# Patient Record
Sex: Female | Born: 1975 | Race: Black or African American | Hispanic: No | Smoking: Current every day smoker
Health system: Southern US, Community
[De-identification: ages and names within clinical notes are randomized; demographics above are authoritative.]

## PROBLEM LIST (undated history)

## (undated) DIAGNOSIS — D860 Sarcoidosis of lung: Secondary | ICD-10-CM

## (undated) DIAGNOSIS — M542 Cervicalgia: Secondary | ICD-10-CM

## (undated) DIAGNOSIS — N840 Polyp of corpus uteri: Secondary | ICD-10-CM

## (undated) DIAGNOSIS — D649 Anemia, unspecified: Secondary | ICD-10-CM

## (undated) DIAGNOSIS — M419 Scoliosis, unspecified: Secondary | ICD-10-CM

## (undated) HISTORY — DX: Polyp of corpus uteri: N84.0

## (undated) HISTORY — PX: OVARIAN CYST REMOVAL: SHX89

## (undated) HISTORY — DX: Scoliosis, unspecified: M41.9

## (undated) HISTORY — DX: Anemia, unspecified: D64.9

## (undated) HISTORY — PX: TUBAL LIGATION: SHX77

## (undated) HISTORY — DX: Sarcoidosis of lung: D86.0

---
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
## 1995-07-04 HISTORY — PX: CHOLECYSTECTOMY: SHX55

## 1997-12-08 ENCOUNTER — Emergency Department (HOSPITAL_COMMUNITY): Admission: EM | Admit: 1997-12-08 | Discharge: 1997-12-08 | Payer: Self-pay | Admitting: Internal Medicine

## 1998-01-27 ENCOUNTER — Emergency Department (HOSPITAL_COMMUNITY): Admission: EM | Admit: 1998-01-27 | Discharge: 1998-01-27 | Payer: Self-pay | Admitting: Emergency Medicine

## 1998-05-16 ENCOUNTER — Inpatient Hospital Stay (HOSPITAL_COMMUNITY): Admission: AD | Admit: 1998-05-16 | Discharge: 1998-05-16 | Payer: Self-pay | Admitting: Obstetrics and Gynecology

## 1998-05-23 ENCOUNTER — Observation Stay (HOSPITAL_COMMUNITY): Admission: AD | Admit: 1998-05-23 | Discharge: 1998-05-24 | Payer: Self-pay | Admitting: Obstetrics & Gynecology

## 1998-06-29 ENCOUNTER — Inpatient Hospital Stay (HOSPITAL_COMMUNITY): Admission: AD | Admit: 1998-06-29 | Discharge: 1998-06-29 | Payer: Self-pay | Admitting: Obstetrics and Gynecology

## 1998-07-19 ENCOUNTER — Emergency Department (HOSPITAL_COMMUNITY): Admission: EM | Admit: 1998-07-19 | Discharge: 1998-07-20 | Payer: Self-pay | Admitting: Emergency Medicine

## 1998-07-20 ENCOUNTER — Encounter (HOSPITAL_BASED_OUTPATIENT_CLINIC_OR_DEPARTMENT_OTHER): Payer: Self-pay | Admitting: General Surgery

## 1998-07-20 ENCOUNTER — Emergency Department (HOSPITAL_COMMUNITY): Admission: EM | Admit: 1998-07-20 | Discharge: 1998-07-20 | Payer: Self-pay | Admitting: Emergency Medicine

## 1998-08-25 ENCOUNTER — Inpatient Hospital Stay (HOSPITAL_COMMUNITY): Admission: AD | Admit: 1998-08-25 | Discharge: 1998-08-25 | Payer: Self-pay | Admitting: Obstetrics

## 1998-08-25 ENCOUNTER — Encounter: Payer: Self-pay | Admitting: Obstetrics and Gynecology

## 1998-08-31 ENCOUNTER — Encounter: Admission: RE | Admit: 1998-08-31 | Discharge: 1998-09-14 | Payer: Self-pay

## 1998-09-10 ENCOUNTER — Inpatient Hospital Stay (HOSPITAL_COMMUNITY): Admission: AD | Admit: 1998-09-10 | Discharge: 1998-09-10 | Payer: Self-pay | Admitting: Obstetrics and Gynecology

## 1998-09-16 ENCOUNTER — Inpatient Hospital Stay (HOSPITAL_COMMUNITY): Admission: RE | Admit: 1998-09-16 | Discharge: 1998-09-17 | Payer: Self-pay | Admitting: General Surgery

## 1998-10-09 ENCOUNTER — Inpatient Hospital Stay (HOSPITAL_COMMUNITY): Admission: AD | Admit: 1998-10-09 | Discharge: 1998-10-09 | Payer: Self-pay | Admitting: Obstetrics & Gynecology

## 1998-10-26 ENCOUNTER — Inpatient Hospital Stay (HOSPITAL_COMMUNITY): Admission: AD | Admit: 1998-10-26 | Discharge: 1998-10-31 | Payer: Self-pay | Admitting: Obstetrics & Gynecology

## 1998-10-28 ENCOUNTER — Encounter: Payer: Self-pay | Admitting: Obstetrics & Gynecology

## 1998-11-03 ENCOUNTER — Inpatient Hospital Stay (HOSPITAL_COMMUNITY): Admission: AD | Admit: 1998-11-03 | Discharge: 1998-11-03 | Payer: Self-pay | Admitting: Obstetrics & Gynecology

## 1998-11-04 ENCOUNTER — Encounter: Payer: Self-pay | Admitting: Emergency Medicine

## 1998-11-04 ENCOUNTER — Encounter: Payer: Self-pay | Admitting: *Deleted

## 1998-11-04 ENCOUNTER — Inpatient Hospital Stay (HOSPITAL_COMMUNITY): Admission: EM | Admit: 1998-11-04 | Discharge: 1998-11-16 | Payer: Self-pay | Admitting: Emergency Medicine

## 1998-11-04 ENCOUNTER — Encounter: Payer: Self-pay | Admitting: Neurology

## 1998-11-05 ENCOUNTER — Encounter: Payer: Self-pay | Admitting: Neurology

## 1998-11-12 ENCOUNTER — Encounter: Payer: Self-pay | Admitting: *Deleted

## 1999-01-11 ENCOUNTER — Emergency Department (HOSPITAL_COMMUNITY): Admission: EM | Admit: 1999-01-11 | Discharge: 1999-01-11 | Payer: Self-pay | Admitting: Emergency Medicine

## 1999-01-21 ENCOUNTER — Emergency Department (HOSPITAL_COMMUNITY): Admission: EM | Admit: 1999-01-21 | Discharge: 1999-01-21 | Payer: Self-pay | Admitting: Emergency Medicine

## 1999-04-10 ENCOUNTER — Emergency Department (HOSPITAL_COMMUNITY): Admission: EM | Admit: 1999-04-10 | Discharge: 1999-04-11 | Payer: Self-pay

## 1999-04-19 ENCOUNTER — Emergency Department (HOSPITAL_COMMUNITY): Admission: EM | Admit: 1999-04-19 | Discharge: 1999-04-19 | Payer: Self-pay | Admitting: Emergency Medicine

## 1999-06-15 ENCOUNTER — Encounter: Payer: Self-pay | Admitting: *Deleted

## 1999-06-15 ENCOUNTER — Inpatient Hospital Stay (HOSPITAL_COMMUNITY): Admission: AD | Admit: 1999-06-15 | Discharge: 1999-06-15 | Payer: Self-pay | Admitting: *Deleted

## 1999-06-17 ENCOUNTER — Ambulatory Visit (HOSPITAL_COMMUNITY): Admission: RE | Admit: 1999-06-17 | Discharge: 1999-06-17 | Payer: Self-pay | Admitting: Obstetrics and Gynecology

## 1999-06-17 ENCOUNTER — Encounter (INDEPENDENT_AMBULATORY_CARE_PROVIDER_SITE_OTHER): Payer: Self-pay

## 1999-11-02 ENCOUNTER — Emergency Department (HOSPITAL_COMMUNITY): Admission: EM | Admit: 1999-11-02 | Discharge: 1999-11-03 | Payer: Self-pay | Admitting: Emergency Medicine

## 2000-08-09 ENCOUNTER — Emergency Department (HOSPITAL_COMMUNITY): Admission: EM | Admit: 2000-08-09 | Discharge: 2000-08-10 | Payer: Self-pay | Admitting: Emergency Medicine

## 2000-11-08 ENCOUNTER — Emergency Department (HOSPITAL_COMMUNITY): Admission: EM | Admit: 2000-11-08 | Discharge: 2000-11-08 | Payer: Self-pay | Admitting: Emergency Medicine

## 2001-03-30 ENCOUNTER — Encounter: Payer: Self-pay | Admitting: Emergency Medicine

## 2001-03-30 ENCOUNTER — Emergency Department (HOSPITAL_COMMUNITY): Admission: EM | Admit: 2001-03-30 | Discharge: 2001-03-30 | Payer: Self-pay | Admitting: Emergency Medicine

## 2001-07-14 ENCOUNTER — Inpatient Hospital Stay (HOSPITAL_COMMUNITY): Admission: AD | Admit: 2001-07-14 | Discharge: 2001-07-14 | Payer: Self-pay | Admitting: Obstetrics & Gynecology

## 2001-07-15 ENCOUNTER — Inpatient Hospital Stay (HOSPITAL_COMMUNITY): Admission: AD | Admit: 2001-07-15 | Discharge: 2001-07-15 | Payer: Self-pay | Admitting: Obstetrics & Gynecology

## 2001-07-15 ENCOUNTER — Encounter: Payer: Self-pay | Admitting: Obstetrics & Gynecology

## 2001-11-13 ENCOUNTER — Inpatient Hospital Stay (HOSPITAL_COMMUNITY): Admission: AD | Admit: 2001-11-13 | Discharge: 2001-11-13 | Payer: Self-pay | Admitting: Obstetrics and Gynecology

## 2001-11-14 ENCOUNTER — Encounter: Payer: Self-pay | Admitting: Obstetrics and Gynecology

## 2001-11-14 ENCOUNTER — Inpatient Hospital Stay: Admission: AD | Admit: 2001-11-14 | Discharge: 2001-11-16 | Payer: Self-pay | Admitting: *Deleted

## 2001-11-17 ENCOUNTER — Observation Stay (HOSPITAL_COMMUNITY): Admission: AD | Admit: 2001-11-17 | Discharge: 2001-11-18 | Payer: Self-pay | Admitting: Obstetrics and Gynecology

## 2001-11-20 ENCOUNTER — Encounter: Admission: RE | Admit: 2001-11-20 | Discharge: 2001-11-20 | Payer: Self-pay | Admitting: *Deleted

## 2001-11-28 ENCOUNTER — Encounter: Admission: RE | Admit: 2001-11-28 | Discharge: 2001-11-28 | Payer: Self-pay | Admitting: *Deleted

## 2001-12-05 ENCOUNTER — Encounter: Admission: RE | Admit: 2001-12-05 | Discharge: 2001-12-05 | Payer: Self-pay | Admitting: *Deleted

## 2001-12-08 ENCOUNTER — Inpatient Hospital Stay (HOSPITAL_COMMUNITY): Admission: AD | Admit: 2001-12-08 | Discharge: 2001-12-08 | Payer: Self-pay | Admitting: *Deleted

## 2001-12-11 ENCOUNTER — Inpatient Hospital Stay (HOSPITAL_COMMUNITY): Admission: AD | Admit: 2001-12-11 | Discharge: 2001-12-11 | Payer: Self-pay | Admitting: Obstetrics and Gynecology

## 2001-12-19 ENCOUNTER — Encounter: Admission: RE | Admit: 2001-12-19 | Discharge: 2001-12-19 | Payer: Self-pay | Admitting: *Deleted

## 2002-01-02 ENCOUNTER — Encounter: Admission: RE | Admit: 2002-01-02 | Discharge: 2002-01-02 | Payer: Self-pay | Admitting: *Deleted

## 2002-01-16 ENCOUNTER — Encounter: Admission: RE | Admit: 2002-01-16 | Discharge: 2002-01-16 | Payer: Self-pay | Admitting: Obstetrics and Gynecology

## 2002-01-21 ENCOUNTER — Inpatient Hospital Stay (HOSPITAL_COMMUNITY): Admission: AD | Admit: 2002-01-21 | Discharge: 2002-01-21 | Payer: Self-pay | Admitting: *Deleted

## 2002-01-24 ENCOUNTER — Inpatient Hospital Stay (HOSPITAL_COMMUNITY): Admission: AD | Admit: 2002-01-24 | Discharge: 2002-01-24 | Payer: Self-pay | Admitting: Obstetrics and Gynecology

## 2002-01-30 ENCOUNTER — Encounter: Admission: RE | Admit: 2002-01-30 | Discharge: 2002-01-30 | Payer: Self-pay | Admitting: *Deleted

## 2002-02-08 ENCOUNTER — Inpatient Hospital Stay (HOSPITAL_COMMUNITY): Admission: AD | Admit: 2002-02-08 | Discharge: 2002-02-08 | Payer: Self-pay | Admitting: *Deleted

## 2002-02-13 ENCOUNTER — Encounter: Admission: RE | Admit: 2002-02-13 | Discharge: 2002-02-13 | Payer: Self-pay | Admitting: *Deleted

## 2002-02-15 ENCOUNTER — Inpatient Hospital Stay (HOSPITAL_COMMUNITY): Admission: AD | Admit: 2002-02-15 | Discharge: 2002-02-15 | Payer: Self-pay | Admitting: *Deleted

## 2002-02-17 ENCOUNTER — Inpatient Hospital Stay (HOSPITAL_COMMUNITY): Admission: AD | Admit: 2002-02-17 | Discharge: 2002-02-20 | Payer: Self-pay | Admitting: Obstetrics and Gynecology

## 2002-06-28 ENCOUNTER — Emergency Department (HOSPITAL_COMMUNITY): Admission: EM | Admit: 2002-06-28 | Discharge: 2002-06-28 | Payer: Self-pay | Admitting: *Deleted

## 2002-06-28 ENCOUNTER — Encounter: Payer: Self-pay | Admitting: Emergency Medicine

## 2002-07-03 DIAGNOSIS — D860 Sarcoidosis of lung: Secondary | ICD-10-CM

## 2002-07-03 HISTORY — DX: Sarcoidosis of lung: D86.0

## 2002-10-30 ENCOUNTER — Emergency Department (HOSPITAL_COMMUNITY): Admission: EM | Admit: 2002-10-30 | Discharge: 2002-10-30 | Payer: Self-pay | Admitting: Emergency Medicine

## 2003-01-02 ENCOUNTER — Emergency Department (HOSPITAL_COMMUNITY): Admission: AD | Admit: 2003-01-02 | Discharge: 2003-01-02 | Payer: Self-pay | Admitting: Emergency Medicine

## 2003-03-29 ENCOUNTER — Emergency Department (HOSPITAL_COMMUNITY): Admission: EM | Admit: 2003-03-29 | Discharge: 2003-03-29 | Payer: Self-pay | Admitting: Emergency Medicine

## 2003-06-18 ENCOUNTER — Emergency Department (HOSPITAL_COMMUNITY): Admission: EM | Admit: 2003-06-18 | Discharge: 2003-06-19 | Payer: Self-pay | Admitting: Emergency Medicine

## 2003-10-11 ENCOUNTER — Emergency Department (HOSPITAL_COMMUNITY): Admission: EM | Admit: 2003-10-11 | Discharge: 2003-10-11 | Payer: Self-pay | Admitting: *Deleted

## 2003-10-19 ENCOUNTER — Ambulatory Visit (HOSPITAL_COMMUNITY): Admission: RE | Admit: 2003-10-19 | Discharge: 2003-10-19 | Payer: Self-pay | Admitting: *Deleted

## 2003-11-15 ENCOUNTER — Inpatient Hospital Stay (HOSPITAL_COMMUNITY): Admission: AD | Admit: 2003-11-15 | Discharge: 2003-11-16 | Payer: Self-pay | Admitting: Obstetrics and Gynecology

## 2003-11-30 ENCOUNTER — Ambulatory Visit (HOSPITAL_COMMUNITY): Admission: RE | Admit: 2003-11-30 | Discharge: 2003-11-30 | Payer: Self-pay | Admitting: Obstetrics and Gynecology

## 2003-12-01 ENCOUNTER — Encounter: Admission: RE | Admit: 2003-12-01 | Discharge: 2003-12-01 | Payer: Self-pay | Admitting: Obstetrics and Gynecology

## 2003-12-11 ENCOUNTER — Inpatient Hospital Stay (HOSPITAL_COMMUNITY): Admission: AD | Admit: 2003-12-11 | Discharge: 2003-12-11 | Payer: Self-pay | Admitting: Obstetrics & Gynecology

## 2004-01-01 ENCOUNTER — Inpatient Hospital Stay (HOSPITAL_COMMUNITY): Admission: AD | Admit: 2004-01-01 | Discharge: 2004-01-01 | Payer: Self-pay | Admitting: Family Medicine

## 2004-01-09 ENCOUNTER — Emergency Department (HOSPITAL_COMMUNITY): Admission: EM | Admit: 2004-01-09 | Discharge: 2004-01-09 | Payer: Self-pay | Admitting: Internal Medicine

## 2004-01-11 ENCOUNTER — Emergency Department (HOSPITAL_COMMUNITY): Admission: EM | Admit: 2004-01-11 | Discharge: 2004-01-11 | Payer: Self-pay | Admitting: Emergency Medicine

## 2004-01-14 ENCOUNTER — Ambulatory Visit (HOSPITAL_COMMUNITY): Admission: RE | Admit: 2004-01-14 | Discharge: 2004-01-14 | Payer: Self-pay | Admitting: Emergency Medicine

## 2004-01-26 ENCOUNTER — Emergency Department (HOSPITAL_COMMUNITY): Admission: EM | Admit: 2004-01-26 | Discharge: 2004-01-26 | Payer: Self-pay | Admitting: Emergency Medicine

## 2004-01-30 ENCOUNTER — Ambulatory Visit (HOSPITAL_COMMUNITY): Admission: RE | Admit: 2004-01-30 | Discharge: 2004-01-30 | Payer: Self-pay | Admitting: Ophthalmology

## 2004-02-03 ENCOUNTER — Inpatient Hospital Stay (HOSPITAL_COMMUNITY): Admission: EM | Admit: 2004-02-03 | Discharge: 2004-02-09 | Payer: Self-pay | Admitting: Emergency Medicine

## 2004-02-05 ENCOUNTER — Encounter (INDEPENDENT_AMBULATORY_CARE_PROVIDER_SITE_OTHER): Payer: Self-pay | Admitting: *Deleted

## 2004-02-09 ENCOUNTER — Encounter (INDEPENDENT_AMBULATORY_CARE_PROVIDER_SITE_OTHER): Payer: Self-pay | Admitting: Specialist

## 2004-02-15 ENCOUNTER — Encounter: Admission: RE | Admit: 2004-02-15 | Discharge: 2004-02-15 | Payer: Self-pay | Admitting: Internal Medicine

## 2004-02-17 ENCOUNTER — Inpatient Hospital Stay (HOSPITAL_COMMUNITY): Admission: AD | Admit: 2004-02-17 | Discharge: 2004-02-22 | Payer: Self-pay | Admitting: Internal Medicine

## 2004-02-17 ENCOUNTER — Encounter: Admission: RE | Admit: 2004-02-17 | Discharge: 2004-02-17 | Payer: Self-pay | Admitting: Internal Medicine

## 2004-03-03 ENCOUNTER — Ambulatory Visit: Payer: Self-pay | Admitting: Internal Medicine

## 2004-03-10 ENCOUNTER — Ambulatory Visit: Payer: Self-pay | Admitting: Infectious Diseases

## 2004-04-06 ENCOUNTER — Ambulatory Visit: Payer: Self-pay | Admitting: Internal Medicine

## 2004-04-12 ENCOUNTER — Ambulatory Visit: Payer: Self-pay | Admitting: Internal Medicine

## 2004-04-12 ENCOUNTER — Inpatient Hospital Stay (HOSPITAL_COMMUNITY): Admission: EM | Admit: 2004-04-12 | Discharge: 2004-04-13 | Payer: Self-pay | Admitting: Emergency Medicine

## 2004-04-22 ENCOUNTER — Ambulatory Visit (HOSPITAL_COMMUNITY): Admission: RE | Admit: 2004-04-22 | Discharge: 2004-04-22 | Payer: Self-pay | Admitting: Internal Medicine

## 2004-04-22 ENCOUNTER — Ambulatory Visit: Payer: Self-pay | Admitting: Internal Medicine

## 2004-04-25 ENCOUNTER — Ambulatory Visit (HOSPITAL_COMMUNITY): Admission: RE | Admit: 2004-04-25 | Discharge: 2004-04-25 | Payer: Self-pay | Admitting: Internal Medicine

## 2004-04-27 ENCOUNTER — Ambulatory Visit: Payer: Self-pay | Admitting: Internal Medicine

## 2004-06-15 ENCOUNTER — Ambulatory Visit: Payer: Self-pay | Admitting: Internal Medicine

## 2004-06-17 ENCOUNTER — Inpatient Hospital Stay (HOSPITAL_COMMUNITY): Admission: AD | Admit: 2004-06-17 | Discharge: 2004-06-17 | Payer: Self-pay | Admitting: Obstetrics & Gynecology

## 2004-06-21 ENCOUNTER — Ambulatory Visit: Payer: Self-pay | Admitting: *Deleted

## 2004-07-05 ENCOUNTER — Ambulatory Visit: Payer: Self-pay | Admitting: *Deleted

## 2004-07-06 ENCOUNTER — Ambulatory Visit: Payer: Self-pay | Admitting: Internal Medicine

## 2004-07-07 ENCOUNTER — Emergency Department (HOSPITAL_COMMUNITY): Admission: EM | Admit: 2004-07-07 | Discharge: 2004-07-08 | Payer: Self-pay | Admitting: Internal Medicine

## 2004-07-13 ENCOUNTER — Inpatient Hospital Stay (HOSPITAL_COMMUNITY): Admission: AD | Admit: 2004-07-13 | Discharge: 2004-07-13 | Payer: Self-pay | Admitting: *Deleted

## 2004-07-28 ENCOUNTER — Ambulatory Visit: Payer: Self-pay | Admitting: Family Medicine

## 2004-07-30 ENCOUNTER — Inpatient Hospital Stay (HOSPITAL_COMMUNITY): Admission: AD | Admit: 2004-07-30 | Discharge: 2004-07-31 | Payer: Self-pay | Admitting: Family Medicine

## 2004-08-11 ENCOUNTER — Ambulatory Visit: Payer: Self-pay | Admitting: Family Medicine

## 2004-08-14 ENCOUNTER — Inpatient Hospital Stay (HOSPITAL_COMMUNITY): Admission: AD | Admit: 2004-08-14 | Discharge: 2004-08-14 | Payer: Self-pay | Admitting: Family Medicine

## 2004-08-25 ENCOUNTER — Ambulatory Visit: Payer: Self-pay | Admitting: Family Medicine

## 2004-08-25 ENCOUNTER — Ambulatory Visit (HOSPITAL_COMMUNITY): Admission: RE | Admit: 2004-08-25 | Discharge: 2004-08-25 | Payer: Self-pay | Admitting: Family Medicine

## 2004-09-01 ENCOUNTER — Ambulatory Visit: Payer: Self-pay | Admitting: Family Medicine

## 2004-09-15 ENCOUNTER — Ambulatory Visit: Payer: Self-pay | Admitting: Family Medicine

## 2004-09-26 ENCOUNTER — Inpatient Hospital Stay (HOSPITAL_COMMUNITY): Admission: AD | Admit: 2004-09-26 | Discharge: 2004-09-26 | Payer: Self-pay | Admitting: Obstetrics & Gynecology

## 2004-09-29 ENCOUNTER — Ambulatory Visit: Payer: Self-pay | Admitting: Family Medicine

## 2004-10-08 ENCOUNTER — Inpatient Hospital Stay (HOSPITAL_COMMUNITY): Admission: AD | Admit: 2004-10-08 | Discharge: 2004-10-10 | Payer: Self-pay | Admitting: *Deleted

## 2004-10-08 ENCOUNTER — Ambulatory Visit: Payer: Self-pay | Admitting: Family Medicine

## 2004-10-13 ENCOUNTER — Ambulatory Visit: Payer: Self-pay | Admitting: Family Medicine

## 2004-10-27 ENCOUNTER — Ambulatory Visit: Payer: Self-pay | Admitting: Family Medicine

## 2004-10-27 ENCOUNTER — Ambulatory Visit (HOSPITAL_COMMUNITY): Admission: RE | Admit: 2004-10-27 | Discharge: 2004-10-27 | Payer: Self-pay | Admitting: *Deleted

## 2004-10-28 ENCOUNTER — Ambulatory Visit: Payer: Self-pay | Admitting: Family Medicine

## 2004-11-10 ENCOUNTER — Ambulatory Visit: Payer: Self-pay | Admitting: *Deleted

## 2004-11-17 ENCOUNTER — Ambulatory Visit: Payer: Self-pay | Admitting: Internal Medicine

## 2004-11-24 ENCOUNTER — Ambulatory Visit: Payer: Self-pay | Admitting: *Deleted

## 2004-11-27 ENCOUNTER — Ambulatory Visit: Payer: Self-pay | Admitting: Family Medicine

## 2004-11-27 ENCOUNTER — Inpatient Hospital Stay (HOSPITAL_COMMUNITY): Admission: AD | Admit: 2004-11-27 | Discharge: 2004-11-28 | Payer: Self-pay | Admitting: Family Medicine

## 2004-12-01 ENCOUNTER — Ambulatory Visit (HOSPITAL_COMMUNITY): Admission: RE | Admit: 2004-12-01 | Discharge: 2004-12-01 | Payer: Self-pay | Admitting: *Deleted

## 2004-12-03 ENCOUNTER — Inpatient Hospital Stay (HOSPITAL_COMMUNITY): Admission: AD | Admit: 2004-12-03 | Discharge: 2004-12-03 | Payer: Self-pay | Admitting: Obstetrics & Gynecology

## 2004-12-03 ENCOUNTER — Ambulatory Visit: Payer: Self-pay | Admitting: Certified Nurse Midwife

## 2004-12-08 ENCOUNTER — Ambulatory Visit: Payer: Self-pay | Admitting: Family Medicine

## 2004-12-22 ENCOUNTER — Ambulatory Visit: Payer: Self-pay | Admitting: Family Medicine

## 2005-01-04 ENCOUNTER — Ambulatory Visit: Payer: Self-pay | Admitting: Obstetrics & Gynecology

## 2005-01-08 ENCOUNTER — Emergency Department (HOSPITAL_COMMUNITY): Admission: EM | Admit: 2005-01-08 | Discharge: 2005-01-08 | Payer: Self-pay | Admitting: Emergency Medicine

## 2005-01-10 ENCOUNTER — Inpatient Hospital Stay (HOSPITAL_COMMUNITY): Admission: AD | Admit: 2005-01-10 | Discharge: 2005-01-10 | Payer: Self-pay | Admitting: *Deleted

## 2005-01-11 ENCOUNTER — Ambulatory Visit: Payer: Self-pay | Admitting: *Deleted

## 2005-01-14 ENCOUNTER — Ambulatory Visit: Payer: Self-pay | Admitting: Obstetrics and Gynecology

## 2005-01-14 ENCOUNTER — Inpatient Hospital Stay (HOSPITAL_COMMUNITY): Admission: AD | Admit: 2005-01-14 | Discharge: 2005-01-14 | Payer: Self-pay | Admitting: Obstetrics & Gynecology

## 2005-01-18 ENCOUNTER — Ambulatory Visit: Payer: Self-pay | Admitting: *Deleted

## 2005-01-25 ENCOUNTER — Ambulatory Visit: Payer: Self-pay | Admitting: Obstetrics and Gynecology

## 2005-01-25 ENCOUNTER — Encounter (INDEPENDENT_AMBULATORY_CARE_PROVIDER_SITE_OTHER): Payer: Self-pay | Admitting: Specialist

## 2005-01-25 ENCOUNTER — Inpatient Hospital Stay (HOSPITAL_COMMUNITY): Admission: AD | Admit: 2005-01-25 | Discharge: 2005-01-27 | Payer: Self-pay | Admitting: *Deleted

## 2005-02-05 ENCOUNTER — Emergency Department (HOSPITAL_COMMUNITY): Admission: EM | Admit: 2005-02-05 | Discharge: 2005-02-06 | Payer: Self-pay | Admitting: Emergency Medicine

## 2005-03-16 ENCOUNTER — Ambulatory Visit: Payer: Self-pay | Admitting: Internal Medicine

## 2005-03-20 ENCOUNTER — Ambulatory Visit (HOSPITAL_COMMUNITY): Admission: RE | Admit: 2005-03-20 | Discharge: 2005-03-20 | Payer: Self-pay | Admitting: Internal Medicine

## 2005-04-13 ENCOUNTER — Ambulatory Visit: Payer: Self-pay | Admitting: Internal Medicine

## 2005-07-04 ENCOUNTER — Encounter: Admission: RE | Admit: 2005-07-04 | Discharge: 2005-07-04 | Payer: Self-pay | Admitting: Neurology

## 2005-09-15 ENCOUNTER — Emergency Department (HOSPITAL_COMMUNITY): Admission: EM | Admit: 2005-09-15 | Discharge: 2005-09-15 | Payer: Self-pay | Admitting: Emergency Medicine

## 2005-10-31 ENCOUNTER — Emergency Department (HOSPITAL_COMMUNITY): Admission: EM | Admit: 2005-10-31 | Discharge: 2005-11-01 | Payer: Self-pay | Admitting: Emergency Medicine

## 2005-12-27 ENCOUNTER — Inpatient Hospital Stay (HOSPITAL_COMMUNITY): Admission: AD | Admit: 2005-12-27 | Discharge: 2005-12-27 | Payer: Self-pay | Admitting: Gynecology

## 2006-01-11 ENCOUNTER — Inpatient Hospital Stay (HOSPITAL_COMMUNITY): Admission: AD | Admit: 2006-01-11 | Discharge: 2006-01-12 | Payer: Self-pay | Admitting: Gynecology

## 2006-01-29 ENCOUNTER — Encounter
Admission: RE | Admit: 2006-01-29 | Discharge: 2006-04-29 | Payer: Self-pay | Admitting: Physical Medicine & Rehabilitation

## 2006-01-29 ENCOUNTER — Ambulatory Visit: Payer: Self-pay | Admitting: Physical Medicine & Rehabilitation

## 2006-05-11 ENCOUNTER — Inpatient Hospital Stay (HOSPITAL_COMMUNITY): Admission: AD | Admit: 2006-05-11 | Discharge: 2006-05-11 | Payer: Self-pay | Admitting: Obstetrics & Gynecology

## 2006-09-09 ENCOUNTER — Emergency Department (HOSPITAL_COMMUNITY): Admission: EM | Admit: 2006-09-09 | Discharge: 2006-09-09 | Payer: Self-pay | Admitting: Emergency Medicine

## 2006-11-19 ENCOUNTER — Emergency Department (HOSPITAL_COMMUNITY): Admission: EM | Admit: 2006-11-19 | Discharge: 2006-11-19 | Payer: Self-pay | Admitting: Emergency Medicine

## 2006-11-20 ENCOUNTER — Emergency Department (HOSPITAL_COMMUNITY): Admission: EM | Admit: 2006-11-20 | Discharge: 2006-11-20 | Payer: Self-pay | Admitting: Family Medicine

## 2006-12-20 ENCOUNTER — Encounter (INDEPENDENT_AMBULATORY_CARE_PROVIDER_SITE_OTHER): Payer: Self-pay | Admitting: Dermatology

## 2006-12-20 ENCOUNTER — Ambulatory Visit: Payer: Self-pay | Admitting: Internal Medicine

## 2006-12-20 DIAGNOSIS — G722 Myopathy due to other toxic agents: Secondary | ICD-10-CM | POA: Insufficient documentation

## 2006-12-20 DIAGNOSIS — M549 Dorsalgia, unspecified: Secondary | ICD-10-CM | POA: Insufficient documentation

## 2006-12-20 DIAGNOSIS — D869 Sarcoidosis, unspecified: Secondary | ICD-10-CM

## 2006-12-20 LAB — CONVERTED CEMR LAB
Bilirubin Urine: NEGATIVE
Leukocytes, UA: NEGATIVE
Specific Gravity, Urine: 1.024 (ref 1.005–1.03)
Urobilinogen, UA: 1 (ref 0.0–1.0)

## 2006-12-24 ENCOUNTER — Ambulatory Visit: Payer: Self-pay | Admitting: Internal Medicine

## 2007-01-29 ENCOUNTER — Encounter: Admission: RE | Admit: 2007-01-29 | Discharge: 2007-03-11 | Payer: Self-pay | Admitting: *Deleted

## 2007-03-11 ENCOUNTER — Encounter (INDEPENDENT_AMBULATORY_CARE_PROVIDER_SITE_OTHER): Payer: Self-pay | Admitting: *Deleted

## 2007-10-21 ENCOUNTER — Ambulatory Visit: Payer: Self-pay | Admitting: Hospitalist

## 2007-10-21 ENCOUNTER — Encounter (INDEPENDENT_AMBULATORY_CARE_PROVIDER_SITE_OTHER): Payer: Self-pay | Admitting: *Deleted

## 2007-10-21 DIAGNOSIS — R0602 Shortness of breath: Secondary | ICD-10-CM | POA: Insufficient documentation

## 2007-10-21 DIAGNOSIS — IMO0001 Reserved for inherently not codable concepts without codable children: Secondary | ICD-10-CM

## 2007-10-21 DIAGNOSIS — H538 Other visual disturbances: Secondary | ICD-10-CM

## 2007-10-22 ENCOUNTER — Ambulatory Visit (HOSPITAL_COMMUNITY): Admission: RE | Admit: 2007-10-22 | Discharge: 2007-10-22 | Payer: Self-pay | Admitting: *Deleted

## 2007-10-23 LAB — CONVERTED CEMR LAB
Angiotensin 1 Converting Enzyme: 39 units/L (ref 9–67)
Beta hcg, urine, semiquantitative: NEGATIVE
Eosinophils Absolute: 0 10*3/uL (ref 0.0–0.7)
Lymphocytes Relative: 35 % (ref 12–46)
Lymphs Abs: 1.7 10*3/uL (ref 0.7–4.0)
MCV: 91.4 fL (ref 78.0–100.0)
Monocytes Relative: 8 % (ref 3–12)
Neutro Abs: 2.7 10*3/uL (ref 1.7–7.7)
Neutrophils Relative %: 56 % (ref 43–77)
RBC: 3.95 M/uL (ref 3.87–5.11)
TSH: 0.692 microintl units/mL (ref 0.350–5.50)
Total CK: 175 units/L (ref 7–177)
WBC: 4.8 10*3/uL (ref 4.0–10.5)

## 2007-10-25 ENCOUNTER — Ambulatory Visit: Payer: Self-pay | Admitting: Infectious Disease

## 2008-02-06 ENCOUNTER — Emergency Department (HOSPITAL_COMMUNITY): Admission: EM | Admit: 2008-02-06 | Discharge: 2008-02-06 | Payer: Self-pay | Admitting: Family Medicine

## 2008-04-06 ENCOUNTER — Emergency Department (HOSPITAL_COMMUNITY): Admission: EM | Admit: 2008-04-06 | Discharge: 2008-04-07 | Payer: Self-pay | Admitting: Emergency Medicine

## 2008-04-24 ENCOUNTER — Emergency Department (HOSPITAL_COMMUNITY): Admission: EM | Admit: 2008-04-24 | Discharge: 2008-04-24 | Payer: Self-pay | Admitting: Emergency Medicine

## 2008-04-24 ENCOUNTER — Telehealth: Payer: Self-pay | Admitting: *Deleted

## 2008-08-26 ENCOUNTER — Ambulatory Visit: Payer: Self-pay | Admitting: Obstetrics and Gynecology

## 2008-08-26 ENCOUNTER — Encounter: Payer: Self-pay | Admitting: Obstetrics and Gynecology

## 2008-08-27 ENCOUNTER — Encounter: Payer: Self-pay | Admitting: Obstetrics and Gynecology

## 2008-08-27 LAB — CONVERTED CEMR LAB: Yeast Wet Prep HPF POC: NONE SEEN

## 2008-08-31 ENCOUNTER — Ambulatory Visit (HOSPITAL_COMMUNITY): Admission: RE | Admit: 2008-08-31 | Discharge: 2008-08-31 | Payer: Self-pay | Admitting: Obstetrics & Gynecology

## 2008-09-17 ENCOUNTER — Encounter: Payer: Self-pay | Admitting: Obstetrics and Gynecology

## 2009-10-28 ENCOUNTER — Telehealth (INDEPENDENT_AMBULATORY_CARE_PROVIDER_SITE_OTHER): Payer: Self-pay | Admitting: *Deleted

## 2009-10-28 ENCOUNTER — Encounter (INDEPENDENT_AMBULATORY_CARE_PROVIDER_SITE_OTHER): Payer: Self-pay | Admitting: *Deleted

## 2010-08-04 NOTE — Letter (Signed)
Summary: [facility] RECALL LETTER  All     ,     Phone:   Fax:     10/28/2009   Donetta L Iseman [address] [place], [place]  [address]   Dear  Ms. Rosellen Ellefson,   You are due to follow-up with a doctor at the [facility].  We have been unable to contact you by phone.  If you would like to schedule a visit, please call [phone].  If you are receiving your health care somewhere else, please call us and we will take your name off our patient list.  Healthy regards,  Raynaldo Opitz, Director [facility]

## 2010-08-04 NOTE — Progress Notes (Signed)
  Phone Note Outgoing Call   Call placed by: Gentry Fitz,  October 28, 2009 10:56 AM Call placed to: Patient Summary of Call: We attempted to call patient in order to schedule a return appointment, since her last [facility] office visit was more than one year ago.  Since we were unable to reach patient by phone, a letter was sent asking her to contact us.  Initial call taken by: Gentry Fitz,  October 28, 2009 10:56 AM

## 2010-08-30 ENCOUNTER — Ambulatory Visit (INDEPENDENT_AMBULATORY_CARE_PROVIDER_SITE_OTHER): Payer: Self-pay

## 2010-08-30 ENCOUNTER — Inpatient Hospital Stay (INDEPENDENT_AMBULATORY_CARE_PROVIDER_SITE_OTHER)
Admission: RE | Admit: 2010-08-30 | Discharge: 2010-08-30 | Disposition: A | Discharge status: Home / Self Care | Payer: Self-pay | Source: Ambulatory Visit | Attending: Emergency Medicine | Admitting: Emergency Medicine

## 2010-08-30 DIAGNOSIS — M2240 Chondromalacia patellae, unspecified knee: Secondary | ICD-10-CM

## 2010-11-15 NOTE — Group Therapy Note (Signed)
[name], [name] NO.:  [id number]   MEDICAL RECORD NO.:  [id number]          PATIENT TYPE:  WOC   LOCATION:  [facility]                   FACILITY:  [facility]   PHYSICIAN:  Argentina Donovan, MD        DATE OF BIRTH:  March 01, 1976   DATE OF SERVICE:  08/26/2008                                  CLINIC NOTE   The patient is a 35 year old African American female gravida 4 para 3-0-  1-3 with last baby here in July of 2006.  [facility].  She had a tubal ligation  immediately postpartum and since that time she has had postcoital  spotting.  It did not last very long but it was almost every time after  sex and no pain involved.  She apparently failed to tell the people at  the health department about this problem and she was with her husband at  that time.  After the baby she had this problem.  She went several years  without coitus and then has a boyfriend now and every time she has sex  she spots.   EXAMINATION:  External genitalia is normal.  BUS is within normal  limits.  Vagina is clean and well rugated.  Cervix is clean and parous.  A Pap smear was taken.  Also with an endocervical brush I just lightly  touched the endocervix and bleeding started. The uterus is anterior,  normal size, shape, consistency with normal adnexa and free cul-de-sac.  I am going to get an ultrasound to make sure there is no sign of any  submucous fibroids.   IMPRESSION:  She probably has endocervicitis.  I do not see any sign of  any polyp in that area and she does have the strong amine odor so wet  prep was taken, but I am going to treat her with Flagyl for 7 days.  I  am going to have her come back in a couple weeks and go over the  ultrasound.  If it is negative, my plan is to do an endocervical  cryosurgery and see if we can get rid of that problem for her.   IMPRESSION:  Postcoital bleeding, probably endocervicitis.     ______________________________  Argentina Donovan, MD     PR/MEDQ  D:  08/26/2008  T:  08/26/2008  Job:  [id number]
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
## 2010-11-18 NOTE — Discharge Summary (Signed)
NAMEJIMMIE, DATTILIO            ACCOUNT NO.:  [id number]   MEDICAL RECORD NO.:  [id number]          PATIENT TYPE:  INP   LOCATION:  5011                         FACILITY:  [facility]   PHYSICIAN:  Duncan Dull, M.D.     DATE OF BIRTH:  [birth year]   DATE OF ADMISSION:  04/12/2004  DATE OF DISCHARGE:  04/13/2004                                 DISCHARGE SUMMARY   DISCHARGE DIAGNOSES:  1.  Sarcoidosis, diagnosed August 2005, lung biopsy with noncaseating      granulomas and ACE level of 80.  2.  Possible fungal pneumonia, August 2005, diagnosis based on pathology      from a lung biopsy, cultures all negative.  3.  History of anemia thought secondary to chronic disease.  4.  Reactive airway disease.  5.  History of optic neuritis, August 2005.   DISCHARGE MEDICATIONS:  1.  Avelox 500 mg daily.  2.  Prednisone 20 mg daily.  3.  Iron sulfate 325 mg twice daily.   CONSULTANTS:  None.   PROCEDURES:  None.   RADIOLOGY:  CT of the chest with contrast negative for acute pulmonary  embolism, but worsening bilateral hilar and mediastinal lymphadenopathy  consistent with sarcoidosis.   DISCHARGE LABORATORIES:  BUN 13, creatinine 0.9, glucose 197, potassium 3.8.  CBC:  Hemoglobin 11.4, white blood count 14.7, platelets 252,000.  HIV  nonreactive.  Western blot pending.  Blood cultures x2 negative.  Total CK  98.  Urine pregnancy negative.   BRIEF HISTORY OF PRESENT ILLNESS:  The patient is a 35 year old African  American female with above past medical history, who presented to the  emergency department with a 40-month history of cough, body aches, pleuritic-  type chest pain.  The patient had had an extensive hospitalization in August  of 2005 with a workup consisting of bronchoscopy and biopsy which revealed  noncaseating granulomas and presumptive diagnosis of sarcoidosis was made.  There is also some thought that she could have a fungal pneumonia, as a  biopsy from the lung revealed  stains with findings consistent with fungal  infection.  She was treated with antifungals for 1 month, but ultimately  thought not to have had a fungal infection.  She decided to come to the  emergency department because her symptoms were not getting better and she  felt her body aches were possibly worse.  She did report a cough; it was  nonproductive.  She denied any fever, chills, nausea, vomiting or diarrhea.   HOSPITAL COURSE:  PROBLEM #1 - PULMONARY INFILTRATE:  She was noted to have  a new infiltrate on the chest x-ray in the emergency department.  She had  denied any symptoms that would have been consistent with a bacterial  pneumonia, though she was started on Rocephin and Zithromax.  A CT scan of  the chest was performed that revealed worsening hilar adenopathy as well as  other areas on the CT also consistent with sarcoid.  Her case was discussed  with Dr. Danice Goltz, who saw her in her previous admission, and it was  felt that her current presentation was  likely a flare of her sarcoidosis.  Her last admission, she had received approximately 22 days of steroids;  these were discontinued because of a fear of a fungal infection for which  she had received 4 weeks of antifungal therapy.  In discussion with Dr.  Jayme Cloud, it was felt that this was likely a sarcoid flare and it was  decided to try her on a longer course of steroids.  We will discharge her on  prednisone 20 mg daily.  She will follow up in approximately 2 weeks and  have a repeat CT scan of the chest.  If at that time her CT findings are not  improved or have become worse, she will need a mediastinoscopy and biopsy.  As Dr. Jayme Cloud stated, this is likely sarcoid, but could be an atypical-  type infection.  It is of note that during this admission she clinically  looked very well and her O2 saturations were 95% to 100% on room air.   PROBLEM #2 - ANEMIA:  Her hemoglobin remained stable and was 11.4 at  discharge.   She had had a ferritin that was checked in the past and this was  14.  When she was admitted, she was not on any iron, though she had been  discharged on it in the past.  Again, I have encouraged her to take the iron  and have given her another prescription for iron.  This may need to be  worked up further as an outpatient.   PROBLEM #3 - HISTORY OF OPTIC NEURITIS:  She was noted to have an optic  neuritis on admission in August.  During this admission, she had no visual  complaints.   PROBLEM #4 - HYPERGLYCEMIA:  On her discharge labs, she was noted to have a  glucose of 190.  It is noted that she had recently eaten when these labs  were drawn, however, given the fact that she will be on steroids for some  time, she probably has a steroid-induced hyperglycemia.  She may need to be  on medication, at least while on the steroids.   FOLLOWUP:  1.  She will follow up on April 25, 2004 for a CT of her chest to evaluate      the abnormal findings noted on this admission.  2.  She will follow up with Dr. Chapman Fitch in the [facility] on the 26th of October at 9:50 a.m.  At that time, the CT above      will need to be reviewed and decision made as to whether she needs to be      referred for biopsy and mediastinoscopy.       WA/MEDQ  D:  04/13/2004  T:  04/13/2004  Job:  [id number]   cc:   Chapman Fitch, MD  Fax: [phone]

## 2010-11-18 NOTE — Procedures (Signed)
PROCEDURE:  Visually-evoked potential test - [id number].   PHYSICIAN:  Deanna Artis. Sharene Skeans, M.D.   INDICATIONS FOR PROCEDURE:  The patient is a 35 year old with blurred  vision.  This study is being done to rule out optic neuritis.   DESCRIPTION OF PROCEDURE:  The tracing is carried out using a 32 x 32 check  pattern oscillating at 1.9 cycles per second.  A 250 msec period was  displayed following the stimulus, with all latencies and inter-peak  latencies expressed in msec.  Low frequency filter 1 Hz, high frequency  filter 100 Hz, and 100 stimuli were averaged in duplicate to provide the  final response.   FINDINGS:  Stimulation of the left eye produced well-defined wave forms with  very good inter-run correlation.  Latencies were as follows:  N1: 77.18, P1:  103.56, N2:  138.73.  Simulated stimulation of the right eye produced well-defined wave forms with  very good inter-run correlation.  Latencies were as follows:  N1:  87.93,  P1:  106.49, N2:  130.92.   IMPRESSION:  These pattern reversal visually-evoked responses are within  normal limits, and show no evidence of conduction abnormality in the  anterior visual pathways of either eye.    WILLIAM H. Sharene Skeans, M.D.   ZOX:WRUE  D:  02/08/2004 11:29:23  T:  02/08/2004 12:31:27  Job #:  [id number]   cc:   Genene Churn. Love, M.D.  [address]  [address]  [place]  [place] [address]  Fax: [phone]

## 2010-11-18 NOTE — Group Therapy Note (Signed)
[name], [name]                      ACCOUNT NO.:  [id number]   MEDICAL RECORD NO.:  [id number]                   PATIENT TYPE:  OUT   LOCATION:  [facility]                           FACILITY:  [facility]   PHYSICIAN:  Elsie Lincoln, MD                   DATE OF BIRTH:  20-Apr-1976   DATE OF SERVICE:                                    CLINIC NOTE   The patient is a 35 year old female, para 3-0-1-2, LMP Nov 21, 2003, who is  referred here from the [facility] for a followup of right hemorrhagic cyst.  The  patient was initially seen at [facility] on October 11, 2003 for pain  and found to have a right ovary measuring 5.5 x 3.7 x 2.9-cm.  The cyst was  3.2 x 2.2 x 2.4-cm.  It was consistent with hemorrhagic cyst.  The patient  had a followup ultrasound eight days later with no change, and finally the  patient had an ultrasound, on Nov 30, 2003, showing resolution of the cyst.  The uterus was also described as normal and the left ovary was described as  normal.  The patient has almost complete resolution of pain.  She  occasionally has some sharp pains intermittently but nothing of importance.  The patient gets her GYN care from [facility]; however, she would like  to transfer to this clinic.  Her last normal Pap smear was October 2004 and  has had no other history of abnormal Pap smears.  She was placed on a  monophasic pill that she has been taking irregularly to help prevent ovarian  cyst formation in the future.  We underwent extensive counseling on how to  take the pills and why to take them.  The patient does not want to become  pregnant at this time, so she will continue the birth control pills to  prevent this.   PAST MEDICAL HISTORY:  Iron deficiency anemia.   PAST SURGICAL HISTORY:  1. Laparoscopic gallbladder.  2. Laparotomy in 1995 with removal of ovarian cyst by Dr. Clearance Coots.  The     patient said this was a benign finding; however, she does not know any     more  details.  3. Inguinal hernia repair.   GYNECOLOGIC HISTORY:  1. No abnormal Pap smears.  2. Ovarian cyst as above.  No fibroids, and no STDs.   ASSESSMENT:  A 35 year old female with resolution of pain and resolution of  right ovarian cyst.   PLAN:  1. Continue OCPs.  The patient was given a prescription for Ovcon 35.  2. Return to clinic in October 2005 for a Pap smear.  3. The patient advised to not become pregnant within one month of stopping     the pill because of increased risk of twins.  Elsie Lincoln, MD   KL/MEDQ  D:  12/01/2003  T:  12/01/2003  Job:  [id number]
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
## 2010-11-18 NOTE — Op Note (Signed)
[name], ALAN            ACCOUNT NO.:  [id number]   MEDICAL RECORD NO.:  [id number]          PATIENT TYPE:  INP   LOCATION:  9105                          FACILITY:  [facility]   PHYSICIAN:  Phil D. Okey Dupre, M.D.     DATE OF BIRTH:  1976-06-13   DATE OF PROCEDURE:  01/25/2005  DATE OF DISCHARGE:                                 OPERATIVE REPORT   PROCEDURE:  Bilateral tubal ligation, partial salpingectomy.   PREOPERATIVE DIAGNOSIS:  Voluntarily sterilization.   POSTOPERATIVE DIAGNOSIS:  Voluntarily sterilization.   SURGEON:  Javier Glazier. Okey Dupre, M.D.   ANESTHESIA:  Epidural.   ESTIMATED BLOOD LOSS:  Less than 5 mL.   POSTOPERATIVE CONDITION:  Satisfactory.   PROCEDURE:  Under satisfactory epidural anesthesia with the patient in the  dorsal supine position, the abdomen was prepped and draped in the usual  sterile manner and entered through a transverse subumbilical incision  approximately 2 cm in length.  The suture was just below the umbilicus.  On  entering the peritoneal cavity, the right fallopian tube was identified,  grasped with a Babcock clamp, an opening made in the avascular portion of  the broad ligament with a hemostat, through which was brought a 1 plain  catgut suture and tied around the distal and proximal end of the tube to  form a loop above the tie of approximately 2 cm in length.  The second tie  placed just above the aforementioned tie with the same material, the section  of the tube above the ties excised and sent for pathologic diagnosis.  The  free ends of the tubes were thus exposed and coagulated with hot cautery,  and the tube was placed back into the peritoneal cavity.  The same procedure  was carried out with regard to the left tube.  There was no bleeding noted.  At this point the fascia was closed with a 2-0 Vicryl running suture and  subcuticular closure with 3-0 Monocryl.  A dry sterile dressing was applied.  The patient tolerated the procedure well.   Tape, instrument, sponge and  needle count were reported correct at the end of the procedure and the  patient was transferred to the recovery room in satisfactory condition.       PDR/MEDQ  D:  01/25/2005  T:  01/25/2005  Job:  [id number]
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
## 2010-11-18 NOTE — Op Note (Signed)
[name], [name]                      ACCOUNT NO.:  [id number]   MEDICAL RECORD NO.:  [id number]                   PATIENT TYPE:  INP   LOCATION:                                       FACILITY:  [facility]   PHYSICIAN:  Danice Goltz, M.D. [facility]            DATE OF BIRTH:  May 07, 1976   DATE OF PROCEDURE:  DATE OF DISCHARGE:                                 OPERATIVE REPORT   DATE OF PROCEDURE:  February 07, 2004.   NAME OF PROCEDURE:  Video bronchoscopy.   BRIEF HISTORY/INDICATION FOR PROCEDURE:  The patient is a 35 year old  African-American female, who presented with a host of neurologic problems to  suggest potential neurosarcoidosis.  The patient also was noted to have  interstitial changes on chest x-ray and hilar adenopathy.  The findings are  suspicious for sarcoid.  The patient had the procedure discussed with  potential benefits, limitations, and complications of the procedure  described, all of the questions were answered, the patient agreed to  proceed.  The patient is an ASA Risk 2.   The patient was taken to the Endoscopy Suite where she was placed on  monitoring equipment.  She was placed on the fluoroscopy table.  Her  posterior pharynx was anesthetized with Cetacaine spray.  She was  premedicated with 5 mg of Versed and 50 mcg of Fentanyl.  The Olympus video  bronchoscope was advanced via the oral route.  The vocal cords were noted to  be normal.  The bronchoscope was then advanced.  The trachea was normal.  Carina was somewhat full, indicating possible mediastinal adenopathy.  The  bronchoscope was then advanced to the right mainstem bronchus.  This was  normal.  The right upper lobe was noted to have normal variant of four  bifurcations to the anterior, posterior, and apical subsegments.  There were  no endobronchial lesions noted.   Bronchoscope was then advanced through the bronchus intermedius to the right  middle lobe and right lower lobe subsegments.  There  were no endobronchial  lesions noted.  There was some erythema about the lateral subsegment of the  right lower lobe with what appears some early granulation change.  This  subsegment was then chosen for biopsies.   Bronchoscope was then brought to the left. and the left upper lobe bronchi,  lingular bronchi, and lower lobe bronchi were all noted to be without any  inflammation, mucosal change, or endobronchial lesion.  At this point, the  bronchoscope was brought back to the right, bronchoalveolar lavage was done  from the right middle lobe with 60 mL total of normal saline used, yielding  40 mL of alloquat.  At this point, the bronchoscope was then brought to the  right lower lobe subsegment that appeared to be somewhat granular in  appearance, and using fluoroscopy for guidance the biopsy forceps were  advanced as distal as allowable and biopsies were taken x5 under fluoro  guidance.  Scan with the fluoro device did not show any immediate  pneumothorax.  At this point, some heme could be seen arising from the right  lower lobe lateral subsegment that was biopsied, and 1.5 mL of 1:10,000  epinephrine diluted with saline was instilled with good hemostasis achieved.  At this point, the bronchoscope was retrieved and the patient was taken back  to the recovery area without any evidence of untoward side effect.   Chest x-ray postop procedure is pending.   IMPRESSION:  Possible sarcoidosis.   PLAN:  Await results of the transbronchial biopsies and BAL sent for T-cell  subsets and cell counts and differential.                                               Danice Goltz, M.D. [facility]    LG/MEDQ  D:  02/05/2004  T:  02/07/2004  Job:  [id number]   cc:   Ileana Roup, M.D.  [address], [place] [address]  Fax: [phone]   Genene Churn. Love, M.D.  [address]  [address]  [place]  [place] [address]  Fax: [phone]
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
## 2010-11-18 NOTE — Discharge Summary (Signed)
[name], [name]                      ACCOUNT NO.:  [id number]   MEDICAL RECORD NO.:  [id number]                   PATIENT TYPE:  INP   LOCATION:  2014                                 FACILITY:  [facility]   PHYSICIAN:  Ileana Roup, M.D.               DATE OF BIRTH:  [birth year]   DATE OF ADMISSION:  02/03/2004  DATE OF DISCHARGE:  02/09/2004                                 DISCHARGE SUMMARY   ADDENDUM:  New results from the lung transbronchial biopsy demonstrated positive PAS  staining which is worrisome for fungus, and AFB, GMS stains so far have been  negative.  Special stains for organism including PAS for fungus, GMS and AFB  were performed.  There is one small granuloma with a fungal/yeast form that  was seen on the PAS stain for fungus.  The GMS, AFB stains were negative,  this result came in today on February 10, 2004.  Unfortunately, [name]  has gone out of town to [place] on a church trip.  I was able to reach her by  cell phone and inform her of the severity of this illness.  Patient states  that she is feeling fine, but I informed her that being on high-dose  steroids and having this fungus found on her stain puts her at higher risk  for having a systemic infection with the possible risk of death.  Patient  was aware of this situation, said that she would go to the emergency room if  she started feeling bad.  I have tried today to get the patient a  prescription of VFEND (voriconazole) 200 mg b.i.d.  The patient is a  Medicaid patient and cannot fill prescription out of state.  The cost of  this medication is close to $1000, the patient does not wish to come home at  this time.  Advised the patient that this would be in her best interest.  However, have found an alternative method of getting the medication to the  patient.  I found a pharmacy that will dispense it to her family member in  the morning, a 30 pill supply of VFEND from a pharmacy here locally in  [place].  Her Mom then, Mrs. Velma [place], will FedEx it to her tomorrow  and she will receive the medication on Friday.  I advised the patient that  if she does not receive this medication that she needs to urgently return to  [place] to get this medication and the optimal situation would be for  her to return immediately to [place] to get the medication locally.      Darrol Jump, MD                           Ileana Roup, M.D.    SD/MEDQ  D:  02/10/2004  T:  02/11/2004  Job:  [id number]
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
## 2011-03-09 ENCOUNTER — Emergency Department (HOSPITAL_COMMUNITY)
Admission: EM | Admit: 2011-03-09 | Discharge: 2011-03-09 | Disposition: A | Discharge status: Home / Self Care | Payer: Self-pay | Attending: Emergency Medicine | Admitting: Emergency Medicine

## 2011-03-09 ENCOUNTER — Emergency Department (HOSPITAL_COMMUNITY): Payer: Self-pay

## 2011-03-09 DIAGNOSIS — J069 Acute upper respiratory infection, unspecified: Secondary | ICD-10-CM | POA: Insufficient documentation

## 2011-03-09 DIAGNOSIS — R059 Cough, unspecified: Secondary | ICD-10-CM | POA: Insufficient documentation

## 2011-03-09 DIAGNOSIS — R05 Cough: Secondary | ICD-10-CM | POA: Insufficient documentation

## 2011-03-09 DIAGNOSIS — IMO0001 Reserved for inherently not codable concepts without codable children: Secondary | ICD-10-CM | POA: Insufficient documentation

## 2011-03-09 DIAGNOSIS — R51 Headache: Secondary | ICD-10-CM | POA: Insufficient documentation

## 2011-03-09 DIAGNOSIS — J3489 Other specified disorders of nose and nasal sinuses: Secondary | ICD-10-CM | POA: Insufficient documentation

## 2011-03-09 DIAGNOSIS — M549 Dorsalgia, unspecified: Secondary | ICD-10-CM | POA: Insufficient documentation

## 2011-03-09 DIAGNOSIS — R509 Fever, unspecified: Secondary | ICD-10-CM | POA: Insufficient documentation

## 2011-03-09 DIAGNOSIS — R079 Chest pain, unspecified: Secondary | ICD-10-CM | POA: Insufficient documentation

## 2011-03-09 LAB — DIFFERENTIAL
Basophils Absolute: 0 10*3/uL (ref 0.0–0.1)
Lymphocytes Relative: 15 % (ref 12–46)
Lymphs Abs: 0.9 10*3/uL (ref 0.7–4.0)
Neutrophils Relative %: 79 % — ABNORMAL HIGH (ref 43–77)

## 2011-03-09 LAB — COMPREHENSIVE METABOLIC PANEL
ALT: 10 U/L (ref 0–35)
AST: 16 U/L (ref 0–37)
Albumin: 4.2 g/dL (ref 3.5–5.2)
CO2: 27 mEq/L (ref 19–32)
Calcium: 8.8 mg/dL (ref 8.4–10.5)
Chloride: 103 mEq/L (ref 96–112)
GFR calc non Af Amer: >60 mL/min (ref >60)
Sodium: 137 mEq/L (ref 135–145)

## 2011-03-09 LAB — CK TOTAL AND CKMB (NOT AT ARMC)
CK, MB: 1.5 ng/mL (ref 0.3–4.0)
Relative Index: 1.1 (ref 0.0–2.5)

## 2011-03-09 LAB — POCT I-STAT TROPONIN I: Troponin i, poc: 0.01 ng/mL (ref 0.00–0.08)

## 2011-03-09 LAB — CBC
HCT: 35.4 % — ABNORMAL LOW (ref 36.0–46.0)
MCV: 90.3 fL (ref 78.0–100.0)
Platelets: 207 10*3/uL (ref 150–400)
RBC: 3.92 MIL/uL (ref 3.87–5.11)
WBC: 6.2 10*3/uL (ref 4.0–10.5)

## 2011-04-03 LAB — URINALYSIS, ROUTINE W REFLEX MICROSCOPIC
Glucose, UA: NEGATIVE
Protein, ur: 100 — AB
pH: 7.5

## 2011-04-03 LAB — URINE CULTURE: Culture: NO GROWTH

## 2011-04-03 LAB — CBC
HCT: 35.5 — ABNORMAL LOW
Hemoglobin: 12
MCHC: 33.8
MCV: 90.4
RBC: 3.93
RDW: 14.1

## 2011-04-03 LAB — DIFFERENTIAL
Basophils Absolute: 0
Basophils Relative: 0
Eosinophils Relative: 1
Monocytes Absolute: 0.4
Monocytes Relative: 10
Neutro Abs: 2.9

## 2011-04-03 LAB — URINE MICROSCOPIC-ADD ON

## 2012-02-01 ENCOUNTER — Ambulatory Visit (INDEPENDENT_AMBULATORY_CARE_PROVIDER_SITE_OTHER): Payer: BC Managed Care – PPO | Admitting: Physician Assistant

## 2012-02-01 ENCOUNTER — Encounter: Payer: Self-pay | Admitting: Physician Assistant

## 2012-02-01 VITALS — BP 95/55 | HR 72 | Temp 97.1°F | Ht 64.0 in | Wt 122.6 lb

## 2012-02-01 DIAGNOSIS — R102 Pelvic and perineal pain: Secondary | ICD-10-CM

## 2012-02-01 DIAGNOSIS — N949 Unspecified condition associated with female genital organs and menstrual cycle: Secondary | ICD-10-CM

## 2012-02-01 NOTE — Progress Notes (Signed)
  [facility]  Patient name: [name] MRN [id number]  Date of birth: February 14, 1976  CC & HPI  [name] is a 36 y.o. female presenting today for evaluation of abdominal pain.  ABDOMINAL PAIN Location: B lower pelvic pain Onset: worsened on 4 months ago Description: constant dull ache with occasional mild sharpness 5-6X per day Modifying factors: improves with heating pad, worse with full bladder.  No medications   Symptoms Nausea/Vomiting: no Diarrhea: no Constipation: no Melena/BRBPR: no Hematemesis: no Anorexia: no Fever/Chills: no Jaundice: no Dysuria: no Back pain: no Rash: no Weight loss: YES - 4# in 1 month Vaginal bleeding: irregular periods; some intermenstrual bleeding; menstrual cramps worsened over the past year. LMP: January 18, 2012 Alcohol use: no NSAID use: no Last PAP - 2009 -no abnormal pap   ROS  See HPI  Pertinent History Reviewed  Medical & Surgical Hx:  Reviewed: Hx of Sarcoidosis, no treatment, significant for bilateral lubal ligation, ovarian cyst removal, 4 vaginal deliveries Medications: Reviewed & Updated - see associated section Social History: Reviewed - Significant for LPN, former smoker  Objective Findings  Vitals:  Filed Vitals:   02/01/12 1506  BP: 95/55  Pulse: 72  Temp: 97.1 F (36.2 C)    PE: GENERAL:  Adult AA female.  Examined in [facility].  In no discomfort; no respiratory distress.   PSYCH: Alert and appropriately interactive; Insight:Good   H&N: AT/[place], MMM, no scleral icterus, EOMi THORAX: HEART: RRR, S1/S2 heard, no murmur LUNGS: CTA B, no wheezes, no crackles EXTREMITIES: Moves all 4 extremities spontaneously, warm well perfused, no edema, bilateral DP and PT pulses 2/4.   Abdomen: +BS, soft, no rigidity, no guarding, no masses/organomegaly.  Tender and guarding over R suprapubic region.  No rebound, negative mcburney's. PELVIC:  Normal vagina; no CMT, normal ovaries,  Normal uterus, TTP  lower pelvic region R>L    Assessment & Plan   I have seen/examined this patient and agree with the above. SHORES,SUZANNE E.

## 2012-02-01 NOTE — Patient Instructions (Signed)

## 2012-02-01 NOTE — Assessment & Plan Note (Addendum)
R>L pelvic pain that is constant for >37months.  No s/sx of infection.  No change in menstural pattern but is irregular.  Still has 12 periods per year. S/p R ovarian cyst removal and B tubal ligation.   Will scan with pelvic US to evaluate for masses.  DDx includes ovarian tumor/cyst, adhesions, uterine fibroids, MSK pain specifically iliopsoas strain.  Less likely UTI (will check urine), complication of sarcoid, intraabdominal infection (i.e appendicitis), ovarian torsion.  RED FLAGS REVIEWED.   Pt will follow up for PAP smear and to review results

## 2012-02-08 ENCOUNTER — Telehealth: Payer: Self-pay | Admitting: *Deleted

## 2012-02-08 ENCOUNTER — Ambulatory Visit (HOSPITAL_COMMUNITY)
Admission: RE | Admit: 2012-02-08 | Discharge: 2012-02-08 | Disposition: A | Discharge status: Home / Self Care | Payer: BC Managed Care – PPO | Source: Ambulatory Visit | Attending: Physician Assistant | Admitting: Physician Assistant

## 2012-02-08 DIAGNOSIS — N949 Unspecified condition associated with female genital organs and menstrual cycle: Secondary | ICD-10-CM | POA: Insufficient documentation

## 2012-02-08 DIAGNOSIS — R102 Pelvic and perineal pain: Secondary | ICD-10-CM

## 2012-02-08 NOTE — Telephone Encounter (Signed)
Patient called wanting results of her tests and ultrasound.

## 2012-02-09 NOTE — Telephone Encounter (Signed)
Patient advised of Normal [place].

## 2012-09-22 ENCOUNTER — Emergency Department (HOSPITAL_COMMUNITY): Payer: BC Managed Care – PPO

## 2012-09-22 ENCOUNTER — Encounter (HOSPITAL_COMMUNITY): Payer: Self-pay | Admitting: *Deleted

## 2012-09-22 ENCOUNTER — Emergency Department (HOSPITAL_COMMUNITY)
Admission: EM | Admit: 2012-09-22 | Discharge: 2012-09-22 | Disposition: A | Discharge status: Home / Self Care | Payer: BC Managed Care – PPO | Attending: Emergency Medicine | Admitting: Emergency Medicine

## 2012-09-22 DIAGNOSIS — R1110 Vomiting, unspecified: Secondary | ICD-10-CM | POA: Insufficient documentation

## 2012-09-22 DIAGNOSIS — R5381 Other malaise: Secondary | ICD-10-CM | POA: Insufficient documentation

## 2012-09-22 DIAGNOSIS — Z862 Personal history of diseases of the blood and blood-forming organs and certain disorders involving the immune mechanism: Secondary | ICD-10-CM | POA: Insufficient documentation

## 2012-09-22 DIAGNOSIS — R5383 Other fatigue: Secondary | ICD-10-CM | POA: Insufficient documentation

## 2012-09-22 DIAGNOSIS — Z8619 Personal history of other infectious and parasitic diseases: Secondary | ICD-10-CM | POA: Insufficient documentation

## 2012-09-22 DIAGNOSIS — Z8742 Personal history of other diseases of the female genital tract: Secondary | ICD-10-CM | POA: Insufficient documentation

## 2012-09-22 DIAGNOSIS — Z8739 Personal history of other diseases of the musculoskeletal system and connective tissue: Secondary | ICD-10-CM | POA: Insufficient documentation

## 2012-09-22 DIAGNOSIS — R109 Unspecified abdominal pain: Secondary | ICD-10-CM

## 2012-09-22 DIAGNOSIS — R6883 Chills (without fever): Secondary | ICD-10-CM | POA: Insufficient documentation

## 2012-09-22 DIAGNOSIS — Z87891 Personal history of nicotine dependence: Secondary | ICD-10-CM | POA: Insufficient documentation

## 2012-09-22 LAB — POCT I-STAT, CHEM 8
Calcium, Ion: 1.12 mmol/L (ref 1.12–1.23)
Creatinine, Ser: 1 mg/dL (ref 0.50–1.10)
Glucose, Bld: 103 mg/dL — ABNORMAL HIGH (ref 70–99)
HCT: 42 % (ref 36.0–46.0)
Hemoglobin: 14.3 g/dL (ref 12.0–15.0)
TCO2: 25 mmol/L (ref 0–100)

## 2012-09-22 LAB — CBC WITH DIFFERENTIAL/PLATELET
Basophils Relative: 0 % (ref 0–1)
Eosinophils Absolute: 0 10*3/uL (ref 0.0–0.7)
Eosinophils Relative: 0 % (ref 0–5)
Hemoglobin: 13.2 g/dL (ref 12.0–15.0)
Lymphs Abs: 0.3 10*3/uL — ABNORMAL LOW (ref 0.7–4.0)
MCH: 30.6 pg (ref 26.0–34.0)
MCHC: 34.7 g/dL (ref 30.0–36.0)
MCV: 88.2 fL (ref 78.0–100.0)
Monocytes Relative: 2 % — ABNORMAL LOW (ref 3–12)
Neutrophils Relative %: 94 % — ABNORMAL HIGH (ref 43–77)
RBC: 4.31 MIL/uL (ref 3.87–5.11)

## 2012-09-22 LAB — CG4 I-STAT (LACTIC ACID): Lactic Acid, Venous: 0.68 mmol/L (ref 0.5–2.2)

## 2012-09-22 LAB — COMPREHENSIVE METABOLIC PANEL
ALT: 13 U/L (ref 0–35)
AST: 22 U/L (ref 0–37)
Albumin: 4.2 g/dL (ref 3.5–5.2)
CO2: 21 mEq/L (ref 19–32)
Calcium: 9.2 mg/dL (ref 8.4–10.5)
Chloride: 107 mEq/L (ref 96–112)
Creatinine, Ser: 0.94 mg/dL (ref 0.50–1.10)
GFR calc non Af Amer: 77 mL/min — ABNORMAL LOW (ref >90)
Sodium: 140 mEq/L (ref 135–145)
Total Bilirubin: 0.7 mg/dL (ref 0.3–1.2)

## 2012-09-22 LAB — WET PREP, GENITAL

## 2012-09-22 LAB — URINALYSIS, ROUTINE W REFLEX MICROSCOPIC
Ketones, ur: 15 mg/dL — AB
Nitrite: NEGATIVE
Urobilinogen, UA: 1 mg/dL (ref 0.0–1.0)

## 2012-09-22 LAB — RPR: RPR Ser Ql: NONREACTIVE

## 2012-09-22 LAB — POCT PREGNANCY, URINE: Preg Test, Ur: NEGATIVE

## 2012-09-22 LAB — URINE MICROSCOPIC-ADD ON

## 2012-09-22 MED ORDER — ONDANSETRON HCL 4 MG/2ML IJ SOLN
4.0000 mg | Freq: Once | INTRAMUSCULAR | Status: AC
  Administered 2012-09-22: 4 mg via INTRAVENOUS

## 2012-09-22 MED ORDER — ONDANSETRON HCL 4 MG PO TABS: 8.0000 mg | ORAL_TABLET | Freq: Three times a day (TID) | ORAL | Status: DC | PRN

## 2012-09-22 MED ORDER — IOHEXOL 300 MG/ML  SOLN
100.0000 mL | Freq: Once | INTRAMUSCULAR | Status: AC | PRN
  Administered 2012-09-22: 100 mL via INTRAVENOUS

## 2012-09-22 MED ORDER — IOHEXOL 300 MG/ML  SOLN: 50.0000 mL | Freq: Once | INTRAMUSCULAR | Status: DC | PRN

## 2012-09-22 MED ORDER — ONDANSETRON HCL 4 MG/2ML IJ SOLN
INTRAMUSCULAR | Status: AC
  Filled 2012-09-22: qty 2

## 2012-09-22 MED ORDER — ACETAMINOPHEN 325 MG PO TABS
650.0000 mg | ORAL_TABLET | Freq: Once | ORAL | Status: AC
  Administered 2012-09-22: 650 mg via ORAL
  Filled 2012-09-22: qty 2

## 2012-09-22 MED ORDER — ACETAMINOPHEN 500 MG PO TABS: 1000.0000 mg | ORAL_TABLET | Freq: Once | ORAL | Status: DC

## 2012-09-22 MED ORDER — SODIUM CHLORIDE 0.9 % IV BOLUS (SEPSIS)
2000.0000 mL | Freq: Once | INTRAVENOUS | Status: AC
  Administered 2012-09-22: 2000 mL via INTRAVENOUS

## 2012-09-22 NOTE — ED Notes (Signed)
Pt back from radiology. Ambulated to restroom with no issues.

## 2012-09-22 NOTE — ED Notes (Signed)
Pt c/o lower abd pain along with n/v x 1 week. Pt reports she started throwing up bld today so her husband and sister made her come. Pt reports she doesn't want to be here. Pt sts "it is too cold".

## 2012-09-22 NOTE — ED Notes (Signed)
Pt reports vomiting that started today, has started to vomit blood. Has fever, bodyaches, and chills.
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
## 2012-09-22 NOTE — ED Notes (Signed)
Pt eating with family. Able to keep food and fluids down with no issues.

## 2012-09-22 NOTE — ED Notes (Signed)
Attempted to in & out cath, unsuccessful. Another RN will try.

## 2012-09-23 LAB — URINE CULTURE: Culture: NO GROWTH

## 2012-12-28 ENCOUNTER — Encounter (HOSPITAL_COMMUNITY): Payer: Self-pay | Admitting: Emergency Medicine

## 2012-12-28 ENCOUNTER — Emergency Department (HOSPITAL_COMMUNITY): Payer: Self-pay

## 2012-12-28 ENCOUNTER — Emergency Department (HOSPITAL_COMMUNITY)
Admission: EM | Admit: 2012-12-28 | Discharge: 2012-12-29 | Disposition: A | Discharge status: Home / Self Care | Payer: Self-pay | Attending: Emergency Medicine | Admitting: Emergency Medicine

## 2012-12-28 DIAGNOSIS — Z8739 Personal history of other diseases of the musculoskeletal system and connective tissue: Secondary | ICD-10-CM | POA: Insufficient documentation

## 2012-12-28 DIAGNOSIS — R102 Pelvic and perineal pain: Secondary | ICD-10-CM

## 2012-12-28 DIAGNOSIS — Z8742 Personal history of other diseases of the female genital tract: Secondary | ICD-10-CM | POA: Insufficient documentation

## 2012-12-28 DIAGNOSIS — Z79899 Other long term (current) drug therapy: Secondary | ICD-10-CM | POA: Insufficient documentation

## 2012-12-28 DIAGNOSIS — Z8709 Personal history of other diseases of the respiratory system: Secondary | ICD-10-CM | POA: Insufficient documentation

## 2012-12-28 DIAGNOSIS — Z862 Personal history of diseases of the blood and blood-forming organs and certain disorders involving the immune mechanism: Secondary | ICD-10-CM | POA: Insufficient documentation

## 2012-12-28 DIAGNOSIS — Z9851 Tubal ligation status: Secondary | ICD-10-CM | POA: Insufficient documentation

## 2012-12-28 DIAGNOSIS — N949 Unspecified condition associated with female genital organs and menstrual cycle: Secondary | ICD-10-CM | POA: Insufficient documentation

## 2012-12-28 DIAGNOSIS — Z87891 Personal history of nicotine dependence: Secondary | ICD-10-CM | POA: Insufficient documentation

## 2012-12-28 DIAGNOSIS — Z9089 Acquired absence of other organs: Secondary | ICD-10-CM | POA: Insufficient documentation

## 2012-12-28 DIAGNOSIS — R109 Unspecified abdominal pain: Secondary | ICD-10-CM | POA: Insufficient documentation

## 2012-12-28 DIAGNOSIS — Z3202 Encounter for pregnancy test, result negative: Secondary | ICD-10-CM | POA: Insufficient documentation

## 2012-12-28 DIAGNOSIS — Z8619 Personal history of other infectious and parasitic diseases: Secondary | ICD-10-CM | POA: Insufficient documentation

## 2012-12-28 DIAGNOSIS — Z9889 Other specified postprocedural states: Secondary | ICD-10-CM | POA: Insufficient documentation

## 2012-12-28 LAB — URINALYSIS, ROUTINE W REFLEX MICROSCOPIC
Bilirubin Urine: NEGATIVE
Glucose, UA: NEGATIVE mg/dL
Ketones, ur: NEGATIVE mg/dL
Nitrite: NEGATIVE
Specific Gravity, Urine: 1.027 (ref 1.005–1.030)
pH: 6.5 (ref 5.0–8.0)

## 2012-12-28 LAB — URINE MICROSCOPIC-ADD ON

## 2012-12-28 LAB — POCT PREGNANCY, URINE: Preg Test, Ur: NEGATIVE

## 2012-12-28 NOTE — ED Provider Notes (Deleted)
Pt was MSE by me in fast track.  H&P along with labs initiated.  Pt has lower abd pain, also has prior abd surgery.  Will likely need further work up including pelvic exam.    This chart was scribed for non-physician practitioner working with Gavin Pound. Oletta Lamas, MD by Greggory Stallion, ED scribe. This patient was seen in room WTR9/WTR9 and the patient's care was started at 11:28 PM.  CSN: [id number] Arrival date & time 12/28/12  2032   Chief Complaint  Patient presents with  . Urinary Retention   The history is provided by the patient. No language interpreter was used.    HPI Comments: [name] is a 37 y.o. female who presents to the Emergency Department complaining of gradual onset, constant, dull, achy lower abdominal pain with associated urinary retention that started one week ago. She states she feels like she has a UTI. Pt states eating makes the pain worse. She states bowel movement and fluctuation make no difference in pain. Pt denies fever, neck pain, sore throat, visual disturbance, CP, cough, SOB, nausea, emesis, diarrhea, hematuria, dysuria, back pain, HA, weakness, numbness and rash as associated symptoms. LNMP was in March. She states her period is often irregular. Pt states she has been pregnant 4 times, with 4 births.   Past Medical History  Diagnosis Date  . Anemia   . Sarcoidosis of lung 2004  . Polyp, uterus corpus   . Scoliosis    Past Surgical History  Procedure Laterality Date  . Ovarian cyst removal  1995  . Hernia repair  1996    Left Inguinal  . Cholecystectomy  1997  . Tubal ligation     Family History  Problem Relation Age of Onset  . Hypertension Mother   . Cancer Maternal Uncle     bone cancer, liver cancer  . Cancer Maternal Grandmother     lung cancer  . Hypertension Maternal Grandmother    History  Substance Use Topics  . Smoking status: Former Smoker -- 1.00 packs/day for 5 years    Types: Cigarettes  . Smokeless tobacco: Never Used  .  Alcohol Use: No   OB History   Grav Para Term Preterm Abortions TAB SAB Ect Mult Living   5 4 4  1  1  0 0 3     Review of Systems  Gastrointestinal: Positive for abdominal pain.  Genitourinary: Positive for difficulty urinating.  All other systems reviewed and are negative.    Allergies  Aspirin and Hydrocodone  Home Medications   Current Outpatient Rx  Name  Route  Sig  Dispense  Refill  . ondansetron (ZOFRAN) 4 MG tablet   Oral   Take 2 tablets (8 mg total) by mouth every 8 (eight) hours as needed for nausea.   12 tablet   0    BP 101/63  Pulse 68  Temp(Src) 98.7 F (37.1 C) (Oral)  Resp 18  Wt 130 lb (58.968 kg)  BMI 22.3 kg/m2  SpO2 100%  LMP 11/14/2012  Physical Exam  Nursing note and vitals reviewed. Constitutional: She is oriented to person, place, and time. She appears well-developed and well-nourished. No distress.  HENT:  Head: Normocephalic and atraumatic.  Eyes: EOM are normal.  Neck: Neck supple. No tracheal deviation present.  Cardiovascular: Normal rate.   Pulmonary/Chest: Effort normal. No respiratory distress.  Musculoskeletal: Normal range of motion.  No CVA tenderness.  Neurological: She is alert and oriented to person, place, and time.  Skin:  Skin is warm and dry.  Well healing midline abdominal surgical scar with no tenderness.  vague suprapubic tenderness without guarding or rebound tenderness Psychiatric: She has a normal mood and affect. Her behavior is normal.    ED Course  Procedures (including critical care time)  DIAGNOSTIC STUDIES: Oxygen Saturation is 100% on RA, normal by my interpretation.    COORDINATION OF CARE: 11:34 PM-Discussed treatment plan which includes moving her to the back for further evaluation with pt at bedside and pt agreed to plan.   Labs Reviewed  URINALYSIS, ROUTINE W REFLEX MICROSCOPIC  POCT PREGNANCY, URINE   No results found. No diagnosis found.  MDM  BP 101/63  Pulse 68  Temp(Src) 98.7 F  (37.1 C) (Oral)  Resp 18  Wt 130 lb (58.968 kg)  BMI 22.3 kg/m2  SpO2 100%  LMP 11/14/2012    I personally performed the services described in this documentation, which was scribed in my presence. The recorded information has been reviewed and is accurate.    Fayrene Helper, PA-C 12/28/12 2339

## 2012-12-28 NOTE — ED Notes (Signed)
Called from waiting room x 1 time. Patient not visualized in the waiting room at this time

## 2012-12-28 NOTE — ED Provider Notes (Deleted)
Medical screening examination/treatment/procedure(s) were performed by non-physician practitioner and as supervising physician I was immediately available for consultation/collaboration.   Gavin Pound. Jabril Pursell, MD 12/28/12 2345

## 2012-12-28 NOTE — ED Notes (Signed)
Patient states that she has an achy type pain to her lower stomach x 1 week. Ptient reports that she feels like she has a UTI

## 2012-12-29 LAB — CBC WITH DIFFERENTIAL/PLATELET
Basophils Absolute: 0 10*3/uL (ref 0.0–0.1)
Basophils Relative: 0 % (ref 0–1)
HCT: 34.1 % — ABNORMAL LOW (ref 36.0–46.0)
Lymphocytes Relative: 41 % (ref 12–46)
MCHC: 34 g/dL (ref 30.0–36.0)
Neutro Abs: 2.6 10*3/uL (ref 1.7–7.7)
Neutrophils Relative %: 49 % (ref 43–77)
Platelets: 202 10*3/uL (ref 150–400)
RDW: 13 % (ref 11.5–15.5)
WBC: 5.3 10*3/uL (ref 4.0–10.5)

## 2012-12-29 LAB — WET PREP, GENITAL: Trich, Wet Prep: NONE SEEN

## 2012-12-29 LAB — COMPREHENSIVE METABOLIC PANEL
ALT: 10 U/L (ref 0–35)
AST: 16 U/L (ref 0–37)
Albumin: 3.6 g/dL (ref 3.5–5.2)
CO2: 23 mEq/L (ref 19–32)
Chloride: 103 mEq/L (ref 96–112)
GFR calc non Af Amer: 77 mL/min — ABNORMAL LOW (ref >90)
Sodium: 135 mEq/L (ref 135–145)
Total Bilirubin: 0.4 mg/dL (ref 0.3–1.2)

## 2012-12-29 MED ORDER — IBUPROFEN 600 MG PO TABS: 600.0000 mg | ORAL_TABLET | Freq: Three times a day (TID) | ORAL | Status: DC | PRN

## 2012-12-29 MED ORDER — IBUPROFEN 100 MG/5ML PO SUSP
800.0000 mg | Freq: Once | ORAL | Status: AC
  Administered 2012-12-29: 800 mg via ORAL
  Filled 2012-12-29: qty 40

## 2012-12-29 MED ORDER — HYDROCODONE-ACETAMINOPHEN 5-325 MG PO TABS: 1.0000 | ORAL_TABLET | ORAL | Status: DC | PRN

## 2012-12-29 NOTE — ED Provider Notes (Signed)
History    CSN: [id number] Arrival date & time 12/28/12  2032  First MD Initiated Contact with Patient 12/28/12 2328     Chief Complaint  Patient presents with  . Urinary Retention   HPI Patient presents with gradual lower abdominal pain over the past week has been aching.  She denies nausea and vomiting.  No fevers or chills.  No vaginal discharge or vaginal odor or pain with intercourse.  She states she's had this pain before but seems to be worsening over the past week.  .  She states she has not been 6 active for several months.  Her symptoms are mild to moderate in severity.  She's not tried any pain medication at home.  She states she had a uterine polyp diagnosed one year ago at the [facility].  She said a cholecystectomy as well as a tubal ligation.   Past Medical History  Diagnosis Date  . Anemia   . Sarcoidosis of lung 2004  . Polyp, uterus corpus   . Scoliosis    Past Surgical History  Procedure Laterality Date  . Ovarian cyst removal  1995  . Hernia repair  1996    Left Inguinal  . Cholecystectomy  1997  . Tubal ligation     Family History  Problem Relation Age of Onset  . Hypertension Mother   . Cancer Maternal Uncle     bone cancer, liver cancer  . Cancer Maternal Grandmother     lung cancer  . Hypertension Maternal Grandmother    History  Substance Use Topics  . Smoking status: Former Smoker -- 1.00 packs/day for 5 years    Types: Cigarettes  . Smokeless tobacco: Never Used  . Alcohol Use: No   OB History   Grav Para Term Preterm Abortions TAB SAB Ect Mult Living   5 4 4  1  1  0 0 3     Review of Systems  All other systems reviewed and are negative.    Allergies  Aspirin and Hydrocodone  Home Medications   Current Outpatient Rx  Name  Route  Sig  Dispense  Refill  . Biotin 5000 MCG CAPS   Oral   Take 5,000 mcg by mouth daily.         Marland Kitchen esomeprazole (NEXIUM) 40 MG capsule   Oral   Take 40 mg by mouth daily before  breakfast.         . HYDROcodone-acetaminophen (NORCO/VICODIN) 5-325 MG per tablet   Oral   Take 1 tablet by mouth every 4 (four) hours as needed for pain.   15 tablet   0   . ibuprofen (ADVIL,MOTRIN) 600 MG tablet   Oral   Take 1 tablet (600 mg total) by mouth every 8 (eight) hours as needed for pain.   15 tablet   0    BP 97/58  Pulse 63  Temp(Src) 98.9 F (37.2 C) (Oral)  Resp 17  Wt 130 lb (58.968 kg)  BMI 22.3 kg/m2  SpO2 99%  LMP 11/14/2012 Physical Exam  Nursing note and vitals reviewed. Constitutional: She is oriented to person, place, and time. She appears well-developed and well-nourished. No distress.  HENT:  Head: Normocephalic and atraumatic.  Eyes: EOM are normal.  Neck: Normal range of motion.  Cardiovascular: Normal rate, regular rhythm and normal heart sounds.   Pulmonary/Chest: Effort normal and breath sounds normal.  Abdominal: Soft. She exhibits no distension. There is no tenderness.  Genitourinary:  Normal external  genitalia.  Normal-appearing cervix.  No cervical motion tenderness.  No adnexal masses or fullness.  No vaginal discharge or bleeding noted  Musculoskeletal: Normal range of motion.  Neurological: She is alert and oriented to person, place, and time.  Skin: Skin is warm and dry.  Psychiatric: She has a normal mood and affect. Judgment normal.    ED Course  Procedures (including critical care time) Labs Reviewed  WET PREP, GENITAL - Abnormal; Notable for the following:    Clue Cells Wet Prep HPF POC FEW (*)    WBC, Wet Prep HPF POC FEW (*)    All other components within normal limits  URINALYSIS, ROUTINE W REFLEX MICROSCOPIC - Abnormal; Notable for the following:    Hgb urine dipstick TRACE (*)    All other components within normal limits  URINE MICROSCOPIC-ADD ON - Abnormal; Notable for the following:    Squamous Epithelial / LPF FEW (*)    All other components within normal limits  CBC WITH DIFFERENTIAL - Abnormal; Notable for  the following:    RBC 3.79 (*)    Hemoglobin 11.6 (*)    HCT 34.1 (*)    All other components within normal limits  COMPREHENSIVE METABOLIC PANEL - Abnormal; Notable for the following:    Glucose, Bld 101 (*)    GFR calc non Af Amer 77 (*)    GFR calc Af Amer 89 (*)    All other components within normal limits  GC/CHLAMYDIA PROBE AMP  LIPASE, BLOOD  POCT PREGNANCY, URINE   Dg Abd Acute W/chest  12/29/2012   *RADIOLOGY REPORT*  Clinical Data: Abdominal pain.  Urinary tension.  ACUTE ABDOMEN SERIES (ABDOMEN 2 VIEW & CHEST 1 VIEW)  Comparison: 09/22/2012 CT.  Chest film 03/09/2011  Findings: Frontal view of the chest demonstrates moderate convex right thoracic spine curvature.  This distorts the appearance of the chest.  Normal heart size.  No pleural effusion or pneumothorax.  Clear lungs.  Abominal films demonstrate no free intraperitoneal air on upright positioning. No significant air fluid levels.  Cholecystectomy clips.  Moderate ascending colonic stool. Distal gas and stool.  No abnormal abdominal calcifications.   No appendicolith.  Mild convex left lumbar spine curvature.  IMPRESSION: No acute findings.   Original Report Authenticated By: Jeronimo Greaves, M.D.   1. Pelvic pain     MDM  Patient feels better after ibuprofen.  No indication for imaging of her abdomen.  Abdominal exam is benign at this time.  Labs urine and pelvic exam are unremarkable.  Followup at the [facility] hospital clinic.  Lyanne Co, MD 12/29/12 [phone]

## 2012-12-30 ENCOUNTER — Telehealth (HOSPITAL_COMMUNITY): Payer: Self-pay | Admitting: Emergency Medicine

## 2012-12-30 LAB — GC/CHLAMYDIA PROBE AMP
CT Probe RNA: NEGATIVE
GC Probe RNA: NEGATIVE

## 2013-01-08 ENCOUNTER — Emergency Department (INDEPENDENT_AMBULATORY_CARE_PROVIDER_SITE_OTHER)
Admission: EM | Admit: 2013-01-08 | Discharge: 2013-01-08 | Disposition: A | Discharge status: Home / Self Care | Payer: Self-pay | Source: Home / Self Care | Attending: Emergency Medicine | Admitting: Emergency Medicine

## 2013-01-08 ENCOUNTER — Encounter (HOSPITAL_COMMUNITY): Payer: Self-pay | Admitting: Emergency Medicine

## 2013-01-08 DIAGNOSIS — H00039 Abscess of eyelid unspecified eye, unspecified eyelid: Secondary | ICD-10-CM

## 2013-01-08 DIAGNOSIS — L03213 Periorbital cellulitis: Secondary | ICD-10-CM

## 2013-01-08 MED ORDER — ERYTHROMYCIN 5 MG/GM OP OINT: TOPICAL_OINTMENT | OPHTHALMIC | Status: DC

## 2013-01-08 MED ORDER — AMOXICILLIN 500 MG PO CAPS: 500.0000 mg | ORAL_CAPSULE | Freq: Three times a day (TID) | ORAL | Status: AC

## 2013-01-08 NOTE — ED Notes (Signed)
Pt c/o left eye pain since yesterday. Eye is red and swollen and has a clear discharge. Has not taken anything for pain. Denies fever. Patient is alert and oriented.

## 2013-01-08 NOTE — ED Notes (Signed)
Patient called requesting meds to be called to [facility] at [place].  Called in amoxicillin and erythromycin as written in discharge instructions by dr Ladon Applebaum.  [phone]

## 2013-01-08 NOTE — ED Provider Notes (Signed)
History    CSN: [id number] Arrival date & time 01/08/13  2952  First MD Initiated Contact with Patient 01/08/13 1011     Chief Complaint  Patient presents with  . Eye Problem   (Consider location/radiation/quality/duration/timing/severity/associated sxs/prior Treatment) HPI Comments: Patient presents urgent care complaining of left eye pain and swelling has gotten worse since yesterday. She denies any recent eye injury or suspect of any foreign body. She describes started with redness and irritation and some mild tearing such as in a pink eye and woke up this morning with swelling around her left eye is somewhat sore and tender with blinking and touch. Denies any fevers, headaches, or any coexistent respiratory symptoms such as runny nose or cough or a sore throat." At one point her hours as well very itchy"  She denies any True visual changes he describes a little vague blurriness was dry tears up or secretes discharge. Which he had some significant clear discharge early this morning.  Patient is a 37 y.o. female presenting with eye problem. The history is provided by the patient and the spouse.  Eye Problem Location:  L eye Quality:  Aching and tearing Severity:  Moderate Onset quality:  Sudden Timing:  Constant Progression:  Worsening Chronicity:  New Relieved by:  Nothing Worsened by:  Nothing tried Associated symptoms: crusting, discharge, itching, redness and swelling   Associated symptoms: no blurred vision, no decreased vision, no double vision, no facial rash, no foreign body sensation, no headaches, no photophobia, no scotomas, no tearing, no tingling, no vomiting and no weakness   Risk factors: previous injury to eye   Risk factors: no conjunctival hemorrhage, not exposed to pinkeye, no recent herpes zoster and no recent URI    Past Medical History  Diagnosis Date  . Anemia   . Sarcoidosis of lung 2004  . Polyp, uterus corpus   . Scoliosis    Past Surgical History    Procedure Laterality Date  . Ovarian cyst removal  1995  . Hernia repair  1996    Left Inguinal  . Cholecystectomy  1997  . Tubal ligation     Family History  Problem Relation Age of Onset  . Hypertension Mother   . Cancer Maternal Uncle     bone cancer, liver cancer  . Cancer Maternal Grandmother     lung cancer  . Hypertension Maternal Grandmother    History  Substance Use Topics  . Smoking status: Former Smoker -- 1.00 packs/day for 5 years    Types: Cigarettes  . Smokeless tobacco: Never Used  . Alcohol Use: No   OB History   Grav Para Term Preterm Abortions TAB SAB Ect Mult Living   5 4 4  1  1  0 0 3     Review of Systems  Constitutional: Negative for fever and fatigue.  Eyes: Positive for discharge, redness and itching. Negative for blurred vision, double vision, photophobia and visual disturbance.  Gastrointestinal: Negative for vomiting.  Skin: Negative for rash.  Neurological: Negative for tingling, weakness and headaches.    Allergies  Aspirin and Hydrocodone  Home Medications   Current Outpatient Rx  Name  Route  Sig  Dispense  Refill  . esomeprazole (NEXIUM) 40 MG capsule   Oral   Take 40 mg by mouth daily before breakfast.         . amoxicillin (AMOXIL) 500 MG capsule   Oral   Take 1 capsule (500 mg total) by mouth 3 (three) times daily.  30 capsule   0   . Biotin 5000 MCG CAPS   Oral   Take 5,000 mcg by mouth daily.         Marland Kitchen erythromycin ophthalmic ointment      Place a 1/2 inch ribbon of ointment into the lower eyelid on left eye x tid x 5 day   1 g   0   . HYDROcodone-acetaminophen (NORCO/VICODIN) 5-325 MG per tablet   Oral   Take 1 tablet by mouth every 4 (four) hours as needed for pain.   15 tablet   0   . ibuprofen (ADVIL,MOTRIN) 600 MG tablet   Oral   Take 1 tablet (600 mg total) by mouth every 8 (eight) hours as needed for pain.   15 tablet   0    BP 131/76  Pulse 65  Temp(Src) 99 F (37.2 C) (Oral)  Resp 14   SpO2 99%  LMP 12/31/2012 Physical Exam  Vitals reviewed. Constitutional: She appears well-developed and well-nourished.  HENT:  Head: Normocephalic.  Mouth/Throat: No oropharyngeal exudate.  Eyes: EOM are normal. Pupils are equal, round, and reactive to light. No foreign bodies found. Left eye exhibits discharge and exudate. Left eye exhibits no chemosis and no hordeolum. No foreign body present in the left eye. Left conjunctiva is injected. Left conjunctiva has no hemorrhage. No scleral icterus. Left eye exhibits normal extraocular motion and no nystagmus. Left pupil is round and reactive.  Slit lamp exam:      The right eye shows no corneal abrasion, no corneal flare, no corneal ulcer, no foreign body, no hyphema, no hypopyon, no fluorescein uptake and no anterior chamber bulge.       The left eye shows no corneal abrasion, no corneal flare, no corneal ulcer, no foreign body, no hyphema, no hypopyon, no fluorescein uptake and no anterior chamber bulge.    Neurological: She is alert.  Skin: No rash noted.    ED Course  Procedures (including critical care time) Labs Reviewed - No data to display No results found. 1. Periorbital cellulitis of left eye     MDM  1-left eye blepharitis with early stages of periorbital cellulitis. Have advised patient to start with antibiotics immediately both ophthalmic and systemic antibiotics. Have encouraged patient to return in 48 hours to recheck her progress. In if any worsening swelling despite antibiotic usage to go immediately to the emergency department. Patient understands treatment plan and need for close followup and understands symptoms that should warrant further evaluation or more immediate attention.  Discharge Medication List as of 01/08/2013  3:50 PM    START taking these medications   Details  amoxicillin (AMOXIL) 500 MG capsule Take 1 capsule (500 mg total) by mouth 3 (three) times daily., Starting 01/08/2013, Last dose on Sat 01/18/13,  Normal    erythromycin ophthalmic ointment Place a 1/2 inch ribbon of ointment into the lower eyelid on left eye x tid x 5 day, Normal        Jimmie Molly, MD 01/08/13 1556

## 2013-01-14 ENCOUNTER — Telehealth (HOSPITAL_COMMUNITY): Payer: Self-pay | Admitting: Emergency Medicine

## 2013-01-14 ENCOUNTER — Emergency Department (HOSPITAL_COMMUNITY): Payer: Self-pay

## 2013-01-14 ENCOUNTER — Encounter (HOSPITAL_COMMUNITY): Payer: Self-pay | Admitting: Family Medicine

## 2013-01-14 ENCOUNTER — Emergency Department (HOSPITAL_COMMUNITY)
Admission: EM | Admit: 2013-01-14 | Discharge: 2013-01-14 | Disposition: A | Discharge status: Home / Self Care | Payer: Self-pay | Attending: Emergency Medicine | Admitting: Emergency Medicine

## 2013-01-14 DIAGNOSIS — Z8739 Personal history of other diseases of the musculoskeletal system and connective tissue: Secondary | ICD-10-CM | POA: Insufficient documentation

## 2013-01-14 DIAGNOSIS — L03211 Cellulitis of face: Secondary | ICD-10-CM | POA: Insufficient documentation

## 2013-01-14 DIAGNOSIS — Z87891 Personal history of nicotine dependence: Secondary | ICD-10-CM | POA: Insufficient documentation

## 2013-01-14 DIAGNOSIS — Z8619 Personal history of other infectious and parasitic diseases: Secondary | ICD-10-CM | POA: Insufficient documentation

## 2013-01-14 DIAGNOSIS — Z8742 Personal history of other diseases of the female genital tract: Secondary | ICD-10-CM | POA: Insufficient documentation

## 2013-01-14 DIAGNOSIS — L0201 Cutaneous abscess of face: Secondary | ICD-10-CM | POA: Insufficient documentation

## 2013-01-14 DIAGNOSIS — Z8709 Personal history of other diseases of the respiratory system: Secondary | ICD-10-CM | POA: Insufficient documentation

## 2013-01-14 DIAGNOSIS — Z79899 Other long term (current) drug therapy: Secondary | ICD-10-CM | POA: Insufficient documentation

## 2013-01-14 DIAGNOSIS — H532 Diplopia: Secondary | ICD-10-CM | POA: Insufficient documentation

## 2013-01-14 DIAGNOSIS — D649 Anemia, unspecified: Secondary | ICD-10-CM | POA: Insufficient documentation

## 2013-01-14 MED ORDER — LINEZOLID 600 MG PO TABS: 600.0000 mg | ORAL_TABLET | Freq: Two times a day (BID) | ORAL | Status: DC

## 2013-01-14 MED ORDER — AMOXICILLIN-POT CLAVULANATE 875-125 MG PO TABS: 1.0000 | ORAL_TABLET | Freq: Two times a day (BID) | ORAL | Status: DC

## 2013-01-14 MED ORDER — IOHEXOL 300 MG/ML  SOLN
100.0000 mL | Freq: Once | INTRAMUSCULAR | Status: AC | PRN
  Administered 2013-01-14: 100 mL via INTRAVENOUS

## 2013-01-14 NOTE — ED Notes (Signed)
Meal given

## 2013-01-14 NOTE — ED Notes (Signed)
Per pt sts here for recheck on right eye. Being treated for periorbital infection and conjunctivitis.

## 2013-01-14 NOTE — ED Notes (Signed)
Patient transported to CT 

## 2013-01-14 NOTE — ED Notes (Signed)
Pt returned from CT. No signs of distress.

## 2013-01-14 NOTE — ED Provider Notes (Signed)
History    CSN: [id number] Arrival date & time 01/14/13  1034  First MD Initiated Contact with Patient 01/14/13 1111     Chief Complaint  Patient presents with  . Eye Problem   (Consider location/radiation/quality/duration/timing/severity/associated sxs/prior Treatment) HPI Comments: Patient is a 37 year old female who presents today for follow up eye check. She was diagnosed with periorbital cellulitis and conjunctivitis on 01/08/13 and given PO amoxicillin and erythromycin ointment. She has been taking these meds as prescribed since that time. Today she continues to complain of blurry and double vision, eye injection, tearing drainage, and swelling around her eye. She states that the area around her eye is sore. Her sinuses began hurting today. She has had no improvement in her conjunctival injection and only minimal improvement in her periorbital swelling.   Patient is a 37 y.o. female presenting with eye problem. The history is provided by the patient. No language interpreter was used.  Eye Problem Associated symptoms: redness   Associated symptoms: no discharge, no nausea and no vomiting    Past Medical History  Diagnosis Date  . Anemia   . Sarcoidosis of lung 2004  . Polyp, uterus corpus   . Scoliosis    Past Surgical History  Procedure Laterality Date  . Ovarian cyst removal  1995  . Hernia repair  1996    Left Inguinal  . Cholecystectomy  1997  . Tubal ligation     Family History  Problem Relation Age of Onset  . Hypertension Mother   . Cancer Maternal Uncle     bone cancer, liver cancer  . Cancer Maternal Grandmother     lung cancer  . Hypertension Maternal Grandmother    History  Substance Use Topics  . Smoking status: Former Smoker -- 1.00 packs/day for 5 years    Types: Cigarettes  . Smokeless tobacco: Never Used  . Alcohol Use: No   OB History   Grav Para Term Preterm Abortions TAB SAB Ect Mult Living   5 4 4  1  1  0 0 3     Review of Systems   Constitutional: Negative for fever and chills.  Eyes: Positive for pain, redness and visual disturbance. Negative for discharge.  Gastrointestinal: Negative for nausea and vomiting.  All other systems reviewed and are negative.    Allergies  Aspirin; Hydrocodone; and Orange concentrate  Home Medications   Current Outpatient Rx  Name  Route  Sig  Dispense  Refill  . amoxicillin (AMOXIL) 500 MG capsule   Oral   Take 1 capsule (500 mg total) by mouth 3 (three) times daily.   30 capsule   0   . Biotin 5000 MCG CAPS   Oral   Take 5,000 mcg by mouth daily.         Marland Kitchen erythromycin ophthalmic ointment      Place a 1/2 inch ribbon of ointment into the lower eyelid on left eye x tid x 5 day   1 g   0   . esomeprazole (NEXIUM) 40 MG capsule   Oral   Take 40 mg by mouth daily before breakfast.         . Zinc 25 MG TABS   Oral   Take 25 mg by mouth daily.         Marland Kitchen HYDROcodone-acetaminophen (NORCO/VICODIN) 5-325 MG per tablet   Oral   Take 1 tablet by mouth every 4 (four) hours as needed for pain.   15 tablet  0   . ibuprofen (ADVIL,MOTRIN) 600 MG tablet   Oral   Take 1 tablet (600 mg total) by mouth every 8 (eight) hours as needed for pain.   15 tablet   0    BP 103/67  Pulse 66  Temp(Src) 98 F (36.7 C) (Oral)  Resp 15  SpO2 98%  LMP 12/31/2012 Physical Exam  Nursing note and vitals reviewed. Constitutional: She is oriented to person, place, and time. She appears well-developed and well-nourished. No distress.  HENT:  Head: Normocephalic and atraumatic.  Right Ear: External ear normal.  Left Ear: External ear normal.  Nose: Nose normal.  Mouth/Throat: Oropharynx is clear and moist.  Eyes: EOM are normal. Pupils are equal, round, and reactive to light. Left conjunctiva is injected.  No pain with consensual light reflex. Significant amount of swelling around left eye - no erythema  Neck: Normal range of motion.  Cardiovascular: Normal rate, regular  rhythm and normal heart sounds.   Pulmonary/Chest: Effort normal and breath sounds normal. No stridor. No respiratory distress. She has no wheezes. She has no rales.  Abdominal: Soft. She exhibits no distension.  Musculoskeletal: Normal range of motion.  Neurological: She is alert and oriented to person, place, and time. She has normal strength.  Skin: Skin is warm and dry. She is not diaphoretic. No erythema.  Psychiatric: She has a normal mood and affect. Her behavior is normal.    ED Course  Procedures (including critical care time) Labs Reviewed - No data to display Ct Orbits W/cm  01/14/2013   *RADIOLOGY REPORT*  Clinical Data: Left blepharitis, periorbital cellulitis.  Continued left eye infection.  CT ORBITS WITH CONTRAST  Technique:  Multidetector CT imaging of the orbits was performed following the bolus administration of intravenous contrast.  Contrast: OMNIPAQUE IOHEXOL 300 MG/ML  SOLN  Comparison: Multiple exams, including 09/15/2005 and 03/20/2005  Findings: Polypoid mucoperiosteal thickening inferiorly in the right maxillary sinus.  Remaining paranasal sinuses and visualized mastoid air cells clear.  No middle ear infection.  Frontal sinuses aplastic.  No bony destructive findings or evidence of prior fracture along the orbits.  The extraocular muscles and optic nerves appear normal and symmetric.  Globes unremarkable.  No postseptal abnormality observed.  No intraorbital infectious process identified.  No abscess observed.  Left periorbital soft tissue swelling is relatively mild at this time.  No adjacent significant abnormal intracranial process near the orbits is identified.  Parapharyngeal space is normal and symmetric mild mucosal swelling is present in the nasal cavity.  IMPRESSION:  1.  Mild left periorbital soft tissue swelling may reflect early periorbital cellulitis.  No postseptal/intraorbital involvement identified. 2.  Mucous retention cyst or small polyp inferiorly in  the right maxillary sinus indicating chronic sinusitis.   Original Report Authenticated By: Gaylyn Rong, M.D.   1. Periorbital cellulitis, left     MDM  Patient presents with periorbital cellulitis. Failed tx on amoxicillin and erythromycin ointment. CT done to r/o spread of infection. Given Zyvox. Return tomorrow for wound recheck. Optho referral given. Discussed case with Dr. Freida Busman who agrees with plan. Vital signs stable for discharge. Patient / Family / Caregiver informed of clinical course, understand medical decision-making process, and agree with plan.   Mora Bellman, PA-C 01/15/13 1051

## 2013-01-14 NOTE — ED Notes (Signed)
Patient currently eating.  Patient will be discharged when she's finished.

## 2013-01-14 NOTE — Progress Notes (Signed)
   CARE MANAGEMENT ED NOTE 01/14/2013  Patient:  [name], [name]   Account Number:  [id number]  Date Initiated:  01/14/2013  Documentation initiated by:  Radford Pax  Subjective/Objective Assessment:   Patient discharged from [place] on Zyvox     Subjective/Objective Assessment Detail:     Action/Plan:   Action/Plan Detail:   Anticipated DC Date:       Status Recommendation to Physician:   Result of Recommendation:    Other ED Services  Consult Working Plan    DC Planning Services  [facility] Program  Medication Assistance  Other    Choice offered to / List presented to:            Status of service:  Completed, signed off  ED Comments:   ED Comments Detail:  [facility] consulted by flow manager to assist with zyvox prescription.  As per flow manager, she spoke to one of the PA's in [facility] ED but patient was already on Augmentin and it did not work.  This was the drug they wanted her on.  [facility] called patient who stated that medication was over 3,000 dollars. EDCM called patient's [facility] who is a participant in the [facility] MATCH program.  [facility] sent over Match letter to patient's pharmacy on [address]  Patient is aware to get her RX filled in seven days and that she is not eligible for this program until next rolling calendar year.  Patient thankful for assistance.  Sent MATCH letter on 01/14/2013 at 1850pm.  Confirmation of receipt on 01/14/2013 at 1852pm.

## 2013-01-15 NOTE — ED Provider Notes (Signed)
Medical screening examination/treatment/procedure(s) were performed by non-physician practitioner and as supervising physician I was immediately available for consultation/collaboration.  Krystena Reitter T Hadyn Blanck, MD 01/15/13 1531 

## 2013-05-08 ENCOUNTER — Other Ambulatory Visit: Payer: Self-pay

## 2013-10-24 ENCOUNTER — Emergency Department (HOSPITAL_COMMUNITY): Payer: Self-pay

## 2013-10-24 ENCOUNTER — Emergency Department (HOSPITAL_COMMUNITY)
Admission: EM | Admit: 2013-10-24 | Discharge: 2013-10-24 | Disposition: A | Discharge status: Home / Self Care | Payer: Self-pay | Attending: Emergency Medicine | Admitting: Emergency Medicine

## 2013-10-24 ENCOUNTER — Encounter (HOSPITAL_COMMUNITY): Payer: Self-pay | Admitting: Emergency Medicine

## 2013-10-24 DIAGNOSIS — R51 Headache: Secondary | ICD-10-CM | POA: Insufficient documentation

## 2013-10-24 DIAGNOSIS — IMO0002 Reserved for concepts with insufficient information to code with codable children: Secondary | ICD-10-CM | POA: Insufficient documentation

## 2013-10-24 DIAGNOSIS — Z8619 Personal history of other infectious and parasitic diseases: Secondary | ICD-10-CM | POA: Insufficient documentation

## 2013-10-24 DIAGNOSIS — F29 Unspecified psychosis not due to a substance or known physiological condition: Secondary | ICD-10-CM | POA: Insufficient documentation

## 2013-10-24 DIAGNOSIS — F17200 Nicotine dependence, unspecified, uncomplicated: Secondary | ICD-10-CM | POA: Insufficient documentation

## 2013-10-24 DIAGNOSIS — Z8739 Personal history of other diseases of the musculoskeletal system and connective tissue: Secondary | ICD-10-CM | POA: Insufficient documentation

## 2013-10-24 DIAGNOSIS — Z862 Personal history of diseases of the blood and blood-forming organs and certain disorders involving the immune mechanism: Secondary | ICD-10-CM | POA: Insufficient documentation

## 2013-10-24 DIAGNOSIS — Z87448 Personal history of other diseases of urinary system: Secondary | ICD-10-CM | POA: Insufficient documentation

## 2013-10-24 DIAGNOSIS — R519 Headache, unspecified: Secondary | ICD-10-CM

## 2013-10-24 DIAGNOSIS — Z8709 Personal history of other diseases of the respiratory system: Secondary | ICD-10-CM | POA: Insufficient documentation

## 2013-10-24 DIAGNOSIS — R4182 Altered mental status, unspecified: Secondary | ICD-10-CM | POA: Insufficient documentation

## 2013-10-24 LAB — COMPREHENSIVE METABOLIC PANEL
ALBUMIN: 3.9 g/dL (ref 3.5–5.2)
ALT: 15 U/L (ref 0–35)
AST: 22 U/L (ref 0–37)
Alkaline Phosphatase: 66 U/L (ref 39–117)
BILIRUBIN TOTAL: 0.6 mg/dL (ref 0.3–1.2)
BUN: 17 mg/dL (ref 6–23)
CHLORIDE: 103 meq/L (ref 96–112)
CO2: 24 meq/L (ref 19–32)
CREATININE: 0.9 mg/dL (ref 0.50–1.10)
Calcium: 9.2 mg/dL (ref 8.4–10.5)
GFR, EST NON AFRICAN AMERICAN: 81 mL/min — AB (ref >90)
Glucose, Bld: 74 mg/dL (ref 70–99)
Potassium: 3.8 mEq/L (ref 3.7–5.3)
Sodium: 139 mEq/L (ref 137–147)
Total Protein: 7.5 g/dL (ref 6.0–8.3)

## 2013-10-24 LAB — CBC
HEMATOCRIT: 36.2 % (ref 36.0–46.0)
Hemoglobin: 12.1 g/dL (ref 12.0–15.0)
MCH: 30.6 pg (ref 26.0–34.0)
MCHC: 33.4 g/dL (ref 30.0–36.0)
MCV: 91.6 fL (ref 78.0–100.0)
Platelets: 170 10*3/uL (ref 150–400)
RBC: 3.95 MIL/uL (ref 3.87–5.11)
RDW: 12.7 % (ref 11.5–15.5)
WBC: 4.8 10*3/uL (ref 4.0–10.5)

## 2013-10-24 LAB — URINALYSIS, ROUTINE W REFLEX MICROSCOPIC
Bilirubin Urine: NEGATIVE
Glucose, UA: NEGATIVE mg/dL
Hgb urine dipstick: NEGATIVE
Ketones, ur: NEGATIVE mg/dL
Leukocytes, UA: NEGATIVE
Nitrite: NEGATIVE
Protein, ur: NEGATIVE mg/dL
Specific Gravity, Urine: 1.018 (ref 1.005–1.030)
Urobilinogen, UA: 1 mg/dL (ref 0.0–1.0)
pH: 6 (ref 5.0–8.0)

## 2013-10-24 MED ORDER — DIPHENHYDRAMINE HCL 50 MG/ML IJ SOLN
25.0000 mg | Freq: Once | INTRAMUSCULAR | Status: AC
  Administered 2013-10-24: 25 mg via INTRAVENOUS
  Filled 2013-10-24: qty 1

## 2013-10-24 MED ORDER — METOCLOPRAMIDE HCL 5 MG/ML IJ SOLN
10.0000 mg | Freq: Once | INTRAMUSCULAR | Status: AC
  Administered 2013-10-24: 10 mg via INTRAVENOUS
  Filled 2013-10-24: qty 2

## 2013-10-24 NOTE — Discharge Instructions (Signed)
Migraine Headache A migraine headache is an intense, throbbing pain on one or both sides of your head. A migraine can last for 30 minutes to several hours. CAUSES  The exact cause of a migraine headache is not always known. However, a migraine may be caused when nerves in the brain become irritated and release chemicals that cause inflammation. This causes pain. Certain things may also trigger migraines, such as:  Alcohol.  Smoking.  Stress.  Menstruation.  Aged cheeses.  Foods or drinks that contain nitrates, glutamate, aspartame, or tyramine.  Lack of sleep.  Chocolate.  Caffeine.  Hunger.  Physical exertion.  Fatigue.  Medicines used to treat chest pain (nitroglycerine), birth control pills, estrogen, and some blood pressure medicines. SIGNS AND SYMPTOMS  Pain on one or both sides of your head.  Pulsating or throbbing pain.  Severe pain that prevents daily activities.  Pain that is aggravated by any physical activity.  Nausea, vomiting, or both.  Dizziness.  Pain with exposure to bright lights, loud noises, or activity.  General sensitivity to bright lights, loud noises, or smells. Before you get a migraine, you may get warning signs that a migraine is coming (aura). An aura may include:  Seeing flashing lights.  Seeing bright spots, halos, or zig-zag lines.  Having tunnel vision or blurred vision.  Having feelings of numbness or tingling.  Having trouble talking.  Having muscle weakness. DIAGNOSIS  A migraine headache is often diagnosed based on:  Symptoms.  Physical exam.  A CT scan or MRI of your head. These imaging tests cannot diagnose migraines, but they can help rule out other causes of headaches. TREATMENT Medicines may be given for pain and nausea. Medicines can also be given to help prevent recurrent migraines.  HOME CARE INSTRUCTIONS  Only take over-the-counter or prescription medicines for pain or discomfort as directed by your  health care provider. The use of long-term narcotics is not recommended.  Lie down in a dark, quiet room when you have a migraine.  Keep a journal to find out what may trigger your migraine headaches. For example, write down:  What you eat and drink.  How much sleep you get.  Any change to your diet or medicines.  Limit alcohol consumption.  Quit smoking if you smoke.  Get 7 9 hours of sleep, or as recommended by your health care provider.  Limit stress.  Keep lights dim if bright lights bother you and make your migraines worse. SEEK IMMEDIATE MEDICAL CARE IF:   Your migraine becomes severe.  You have a fever.  You have a stiff neck.  You have vision loss.  You have muscular weakness or loss of muscle control.  You start losing your balance or have trouble walking.  You feel faint or pass out.  You have severe symptoms that are different from your first symptoms. MAKE SURE YOU:   Understand these instructions.  Will watch your condition.  Will get help right away if you are not doing well or get worse. Document Released: 06/19/2005 Document Revised: 04/09/2013 Document Reviewed: 02/24/2013 [facility] Patient Information 2014 [employer], [place].   Emergency Department Resource Guide 1) Find a Doctor and Pay Out of Pocket Although you won't have to find out who is covered by your insurance plan, it is a good idea to ask around and get recommendations. You will then need to call the office and see if the doctor you have chosen will accept you as a new patient and what types of options  they offer for patients who are self-pay. Some doctors offer discounts or will set up payment plans for their patients who do not have insurance, but you will need to ask so you aren't surprised when you get to your appointment. ° °2) Contact Your Local Health Department °Not all health departments have doctors that can see patients for sick visits, but many do, so it is worth a call to see if  yours does. If you don't know where your local health department is, you can check in your phone book. The CDC also has a tool to help you locate your state's health department, and many state websites also have listings of all of their local health departments. ° °3) Find a Walk-in Clinic °If your illness is not likely to be very severe or complicated, you may want to try a walk in clinic. These are popping up all over the country in pharmacies, drugstores, and shopping centers. They're usually staffed by nurse practitioners or physician assistants that have been trained to treat common illnesses and complaints. They're usually fairly quick and inexpensive. However, if you have serious medical issues or chronic medical problems, these are probably not your best option. ° °No Primary Care Doctor: °- Call Health Connect at  832-8000 - they can help you locate a primary care doctor that  accepts your insurance, provides certain services, etc. °- Physician Referral Service- 1-800-533-3463 ° °Chronic Pain Problems: °Organization         Address  Phone   Notes  °[facility]  (336) 297-2271 Patients need to be referred by their primary care doctor.  ° °Medication Assistance: °Organization         Address  Phone   Notes  °[place] [facility] [address] °[place], [place] [address] (336) 641-8030 --Must be a resident of [place] °-- Must have NO insurance coverage whatsoever (no Medicaid/ Medicare, etc.) °-- The pt. MUST have a primary care doctor that directs their care regularly and follows them in the community °  °[facility]  (866) 331-1348   °[employer]  (888) 892-1162   ° °Agencies that provide inexpensive medical care: °Organization         Address  Phone   Notes  °[facility]  (336) 832-8035   °[facility]    (336) 832-7272   °[facility] [address] °[place], [place] [address] (336) 832-4777    °[facility] [address], °[place] (336) 271-4999   °[facility]    (336) 373-0678   °[facility]    (336) 272-1050   °[facility] ° [address], [place] Phone:  (336) 832-4444, Fax:  (336) 832-4440 Hours of Operation:  9 am - 6 pm, M-F.  Also accepts Medicaid/Medicare and self-pay.  °[facility] ° [address], [place] Phone: (336) 832-3150, Fax: (336) 832-3151. Hours of Operation:  8:30 am - 5:30 pm, M-F.  Also accepts Medicaid and self-pay.  °[facility] [place] [address], [place] Phone: (336) 878-6027   °[facility] [address], [place],  (336)723-1848, Ext. 123 Mondays & Thursdays: 7-9 AM.  First 15 patients are seen on a first come, first serve basis. °  ° °Medicaid-accepting [place] Providers: ° °Organization         Address  Phone   Notes  °[facility] [address], [place] (336) 641-2100 Also accepts self-pay patients.  °[facility] [address], [place] ° (336) 856-9996   °[facility] [address], [place] (336) 288-8857   °[facility] [address] [place] [address], [place] (336) 299-7000   °Veita Bland [address], [place]  ° (336) 373-1557 Only accepts [place] Access Medicaid patients after they have their name applied to their card.  ° °Self-Pay (no insurance) in [place]: ° °Organization         Address  Phone   Notes  °Sickle Cell Patients, [facility] [address], [place] (336) 832-1970   °[facility] [address], [place] (336) 832-4400   °[facility] [place] ° [address], [place] (336) 992-4800   °[facility]/Dr. Osei-Bonsu ° [address] [place] [address], [place] or [address], [place] (336)  841-8500 Phone number for both [place] and [place] locations is the same.  °[facility] [address], [place] (336) 299-0000   °[facility] [address] [place] [address], [place] or [address] (336) 852-7530 °(336) 878-2260   °[facility] [address], [place] (336) 350-1642, phone; (336) 294-5005, fax Sees patients 1st and 3rd Saturday of every month.  Must not qualify for public or private insurance (i.e. Medicaid, Medicare, [place] Health Choice, Veterans' Benefits) • Household income should be no more than 200% of the poverty level •The clinic cannot treat you if you are pregnant or think you are pregnant • Sexually transmitted diseases are not treated at the clinic.  ° ° °Dental Care: °Organization         Address  Phone  Notes  °[place] [facility] [address], [place] (336) 641-6152 Accepts children up to age 21 who are enrolled in Medicaid or [place] Health Choice; pregnant women with a Medicaid card; and children who have applied for Medicaid or [place] Health Choice, but were declined, whose parents can pay a reduced fee at time of service.  °[facility] [place]  [address], [place] (336) 641-7733 Accepts children up to age 21 who are enrolled in Medicaid or [place] Health Choice; pregnant women with a Medicaid card; and children who have applied for Medicaid or [place] Health Choice, but were declined, whose parents can pay a reduced fee at time of service.  °[facility] ° [address], [place] (336) 641-4533 Patients are seen by appointment only. Walk-ins are not accepted. [facility] will see patients 18 years of age and older. °Monday - Tuesday (8am-5pm) °Most Wednesdays (8:30-5pm) °$30 per visit, cash only  °[facility] ° [address], [place] (336) 641-4533 Patients are seen by  appointment only. Walk-ins are not accepted. [facility] will see patients 18 years of age and older. °One Wednesday Evening (Monthly: Volunteer Based).  $30 per visit, cash only  °[facility]  (919) 537-3737 for adults; Children under age 4, call Graduate Pediatric Dentistry at (919) [phone]. Children aged 4-14, please call (919) [phone] to request a pediatric application. ° Dental services are provided in all areas of dental care including fillings, crowns and bridges, complete and partial dentures,   implants, gum treatment, root canals, and extractions. Preventive care is also provided. Treatment is provided to both adults and children. °Patients are selected via a lottery and there is often a waiting list. °  °[facility] [address], °[place] ° (336) 763-8833 [email or web address].[email or web address] °  °[facility] [address], [place], [place] (336)723-1848, Ext. 123 Second and Fourth Thursday of each month, opens at 6:30 AM; Clinic ends at 9 AM.  Patients are seen on a first-come first-served basis, and a limited number are seen during each clinic.  ° °[facility] ° [address], [place], [place] (336) 723-7904   Eligibility Requirements °You must have lived in [place], [place], or [place] counties for at least the last three months. °  You cannot be eligible for state or federal sponsored healthcare insurance, including Veterans Administration, Medicaid, or Medicare. °  You generally cannot be eligible for healthcare insurance through your employer.  °  How to apply: °Eligibility screenings are held every Tuesday and Wednesday afternoon from 1:00 pm until 4:00 pm. You do not need an appointment for the interview!  °[facility] [address], [place], [place] 336-631-2330   °[facility]  336-342-8273   °[facility]  336-703-3100   °[facility]  336-570-6415    ° °Behavioral Health Resources in the Community: °Intensive Outpatient Programs °Organization         Address  Phone  Notes  °[facility] [address], [place], [place] 336-878-6098   °[facility] [address], [place], [place] 336-832-9800   °[facility] [address], [place], [place] ° 336-882-2125   °[facility] [address],  °[place], [place] 1-800-853-5163 or 336-641-4981   °Substance Abuse Resources °Organization         Address  Phone  Notes  °[facility]  336-882-2125   °[facility]  336-784-9470   °[facility]  336-285-9073   °[facility]  336-845-3988   °Residential & Outpatient Substance Abuse Program  1-800-659-3381   °Psychological Services °Organization         Address  Phone  Notes  °[facility]  336- 832-9600   °[facility]  336- 378-7881   °[facility] [address], [place] 1-800-853-5163 or 336-641-4981   ° °Mobile Crisis Teams °Organization         Address  Phone  Notes  °[facility], Mobile Crisis Care Unit  1-877-626-1772   °Assertive °[facility] ° [address] [place], [place] 336-834-9664   °Sharon DeEsch [address] ° [place] 336-554-5454   ° °Self-Help/Support Groups °Organization         Address  Phone             Notes  °[facility]  - variety of support groups  336- 373-1402 Call for more information  °Narcotics Anonymous (NA), [facility] [address], °[place]   2 meetings at this location  ° °Residential Treatment Programs °Organization         Address  Phone  Notes  °[facility] [address],    °   1-866-801-8205   °[facility] ° [address], [place],  704-293-8524   °[facility] [address], [place] 336-845-3988 Admissions: 8am-3pm M-F  °Incentives    Substance [place] [address]    [place], [place] 924-268-3419   [facility] [address] [place], [place], [place]   [facility] [address]  [place], [place]   [facility] Intensive Outpatient [address], [place], [place]   [facility] ([place].,  [place], [place] 1-(601) 409-3143 or 262-327-7195   [facility] ([facility] [address] [place], [place] Accepts Medicaid  [facility] [address],  [place] 1-(671)521-5825 Substance Abuse/Addiction Treatment   [facility] Organization         Address  Phone  Notes  [facility]  (229)254-1853   Domenic Schwab, PhD [address] [place], [place]   (920) 028-3814 or 7343549582   [facility] [place], [place] [phone]   [facility] [address], [place], [place] [phone] Insurance/Medicaid/sponsorship through [facility] [address]                                    [place], [place] [phone] [place] [address], [place] [phone]    Dr. Adele Schilder  [phone]   [facility] Dept. 1) [address], [place] 2) [place] 3)  [address], [place] [phone]  [phone]   [place] (623) [phone] or [phone] (After Hours)

## 2013-10-24 NOTE — Progress Notes (Signed)
[facility] provided pt with a list of primary care resources and a [facility] [place] Card application to help patient establish primary care.  °

## 2013-10-24 NOTE — ED Notes (Signed)
Pt reports confusion and memory loss onset Monday April 13. Pt reports can't remember anything from last week. Pt reports doing "strange things like taking off driving with the door open," increased agitation. Pt reports increased stress recently. Onset headache behind R eye and pain from R shoulder radiating down arm, numbness and tingling in R fingers.

## 2013-10-24 NOTE — ED Provider Notes (Signed)
CSN: [id number]     Arrival date & time 10/24/13  1445 History   First MD Initiated Contact with Patient 10/24/13 1505     Chief Complaint  Patient presents with  . Altered Mental Status  . Migraine     (Consider location/radiation/quality/duration/timing/severity/associated sxs/prior Treatment) HPI Comments: Episodes of confusion for past week - forgetful - like locking keys in car, beginning to drive with the doors of the car open. No hx of confusion like this. Feels like it is due to stress.  Was at pharmacy, got irritated with someone there, had sudden onset headache, described as something exploding behind her R eye. No hx of aneurysm, stroke, no family hx of stroke/aneurysm.  Patient is a 38 y.o. female presenting with altered mental status and migraines. The history is provided by the patient.  Altered Mental Status Presenting symptoms: confusion   Severity:  Mild Most recent episode:  Today Episode history:  Multiple Timing:  Intermittent Progression:  Worsening Chronicity:  New Context: not a recent change in medication and not a recent illness   Context comment:  Stress Associated symptoms: agitation and headaches   Associated symptoms: no abdominal pain, no depression, no difficulty breathing, no fever, no nausea, no palpitations and no vomiting   Headaches:    Severity:  Moderate   Onset quality:  Sudden   Timing:  Constant   Progression:  Improving   Chronicity:  New Migraine Associated symptoms include headaches. Pertinent negatives include no chest pain, no abdominal pain and no shortness of breath.    Past Medical History  Diagnosis Date  . Anemia   . Sarcoidosis of lung 2004  . Polyp, uterus corpus   . Scoliosis    Past Surgical History  Procedure Laterality Date  . Ovarian cyst removal  1995  . Hernia repair  1996    Left Inguinal  . Cholecystectomy  1997  . Tubal ligation     Family History  Problem Relation Age of Onset  . Hypertension Mother    . Cancer Maternal Uncle     bone cancer, liver cancer  . Cancer Maternal Grandmother     lung cancer  . Hypertension Maternal Grandmother    History  Substance Use Topics  . Smoking status: Current Every Day Smoker -- 0.50 packs/day for 5 years    Types: Cigarettes  . Smokeless tobacco: Never Used  . Alcohol Use: Yes     Comment: occasional   OB History   Grav Para Term Preterm Abortions TAB SAB Ect Mult Living   5 4 4  1  1  0 0 3     Review of Systems  Constitutional: Negative for fever and chills.  Respiratory: Negative for cough and shortness of breath.   Cardiovascular: Negative for chest pain and palpitations.  Gastrointestinal: Negative for nausea, vomiting and abdominal pain.  Neurological: Positive for headaches.  Psychiatric/Behavioral: Positive for confusion and agitation.  All other systems reviewed and are negative.     Allergies  Aspirin; Hydrocodone; and Orange concentrate  Home Medications   Prior to Admission medications   Medication Sig Start Date End Date Taking? Authorizing Provider  simethicone (MYLICON) 80 MG chewable tablet Chew 80 mg by mouth every 6 (six) hours as needed for flatulence (gas).   Yes Historical Provider, MD   BP 113/69  Pulse 66  Temp(Src) 98.9 F (37.2 C) (Oral)  Resp 21  SpO2 100%  LMP 10/01/2013 Physical Exam  Nursing note and vitals reviewed. Constitutional: She  is oriented to person, place, and time. She appears well-developed and well-nourished. No distress.  HENT:  Head: Normocephalic and atraumatic.  Mouth/Throat: No oropharyngeal exudate.  Eyes: EOM are normal. Pupils are equal, round, and reactive to light.  Neck: Normal range of motion. Neck supple.  Cardiovascular: Normal rate and regular rhythm.  Exam reveals no friction rub.   No murmur heard. Pulmonary/Chest: Effort normal and breath sounds normal. No respiratory distress. She has no wheezes. She has no rales.  Abdominal: Soft. She exhibits no  distension. There is no tenderness. There is no rebound.  Musculoskeletal: Normal range of motion. She exhibits no edema.  Neurological: She is alert and oriented to person, place, and time. No cranial nerve deficit. She exhibits normal muscle tone. Coordination normal.  Skin: Skin is warm. No rash noted. She is not diaphoretic.    ED Course  Procedures (including critical care time) Labs Review Labs Reviewed  CBC  COMPREHENSIVE METABOLIC PANEL  URINALYSIS, ROUTINE W REFLEX MICROSCOPIC    Imaging Review Ct Head Wo Contrast  10/24/2013   CLINICAL DATA:  Confusion, memory loss, right-sided headache/eye pain  EXAM: CT HEAD WITHOUT CONTRAST  TECHNIQUE: Contiguous axial images were obtained from the base of the skull through the vertex without intravenous contrast.  COMPARISON:  CT head dated 09/15/2005  FINDINGS: No evidence of parenchymal hemorrhage or extra-axial fluid collection. No mass lesion, mass effect, or midline shift.  No CT evidence of acute infarction.  Cerebral volume is within normal limits.  No ventriculomegaly.  The visualized paranasal sinuses are essentially clear. The mastoid air cells are unopacified.  No evidence of calvarial fracture.  IMPRESSION: Normal head CT.   Electronically Signed   By: Charline BillsSriyesh  Krishnan M.D.   On: 10/24/2013 17:22     EKG Interpretation None      MDM   Final diagnoses:  Headache    38 year old female with history of sarcoidosis presents with multiple complaints. Confusion: Began about a week ago. Sporadic. Forgetfulness, disorientation - like forgetting what she is doing, locking keys in the car. No fevers, shortness of breath, signs of infection. No prior history of this. She feels is due to stress. Stress is getting better but not totally improved. Headache: Began suddenly, folic explosion in her head. Was getting stressed out someone at a pharmacy. Improving, but has not taken anything for this. Had some right arm aching and right finger  tingling. On exam, vitals stable, no hypertension. She is alert and oriented and not confused with me. No numbness or tingling on exam. No strength or sensation deficits. Normal cranial nerves With explosion sensation in her head when under stress, will scan her head [facility]. Head scan normal. Feeling better after headache cocktail. I explained to patient for thorough workup to fully r/o SAH we need to perform an LP. I explained risks of missing head bleed. She does not want to pursue LP. She is alert/oriented, able to make her own decisions. Patient stable for discharge, feeling better.   Dagmar HaitWilliam Marque Bango, MD 10/24/13 [phone]

## 2013-12-25 ENCOUNTER — Ambulatory Visit (INDEPENDENT_AMBULATORY_CARE_PROVIDER_SITE_OTHER): Payer: Self-pay | Admitting: Obstetrics & Gynecology

## 2013-12-25 ENCOUNTER — Encounter: Payer: Self-pay | Admitting: Obstetrics & Gynecology

## 2013-12-25 VITALS — BP 107/68 | HR 77 | Ht 63.0 in | Wt 135.6 lb

## 2013-12-25 DIAGNOSIS — F39 Unspecified mood [affective] disorder: Secondary | ICD-10-CM

## 2013-12-25 DIAGNOSIS — R4586 Emotional lability: Secondary | ICD-10-CM

## 2013-12-25 DIAGNOSIS — N951 Menopausal and female climacteric states: Secondary | ICD-10-CM

## 2013-12-25 DIAGNOSIS — R232 Flushing: Secondary | ICD-10-CM

## 2013-12-25 NOTE — Progress Notes (Signed)
Subjective:     Patient ID: [name], female   DOB: 1975/11/25, 38 y.o.   MRN: [id number]  HPI 38 y.o. [id number] female who presents to clinic with concerns for early menopause. --Tubal ligation in 2006.  Wonders if she is having side effects from her surgery. --She reports having hot flashes, more outbursts of emotion, pain with intercourse --Reports hx of polyp on her uterus, never had removed --Periods a little irregular, may miss period in March, but comes start of April --Lasting about 7 days, only bleeds first two to three days  --Reports hot flashes "feeling like burning from inside", breaking out in sweats.  Tries to cool down with taking off shoes and less clothing.  Hot flashes occur several times per week.  Started around October of last year. --She has had some vaginal dryness symptoms.  --Current smoker about 6 to 7 cigarettes daily, off and on use for 5 years  --Last pap 08/2008   Review of Systems -- negative comprehensive ROS unless stated in HPI     Objective:   Physical Exam  Constitutional: She appears well-developed and well-nourished. No distress.  HENT:  Head: Normocephalic and atraumatic.  Eyes: Pupils are equal, round, and reactive to light. No scleral icterus.  Neck: Normal range of motion. Neck supple.  Cardiovascular: Normal rate, regular rhythm, normal heart sounds and intact distal pulses.   No murmur heard. Pulmonary/Chest: Effort normal and breath sounds normal. No respiratory distress.  Abdominal: Soft. She exhibits no distension. There is no tenderness.  Skin: She is not diaphoretic.   BP 107/68  Pulse 77  Ht 5\' 3"  (1.6 m)  Wt 135 lb 9.6 oz (61.508 kg)  BMI 24.03 kg/m2  LMP 12/10/2013      Assessment:     Hot flashes, vaginal dryness, emotional lability     Plan:     --Extensive discussion on perimenopause, discussed that TBL not related --Reassured that still having regular periods --Encouraged smoking cessation --Patient  will return to clinic for TSH testing.  She is paying out of pocket.  Cost given to patient of $60.  She will return to clinic for testing.  --Patient anticipates insurance coverage by beginning of August and will make appt to come back for annual visit to complete pap smear.  Given number to free Pap clinic if would like to complete sooner.     Mary L. Peyton NajjarPippin, MD, [facility]

## 2013-12-25 NOTE — Progress Notes (Deleted)
Subjective:     Patient ID: Shelia BlueMargaret L South, female   DOB: Aug 27, 1975, 38 y.o.   MRN: [id number]  HPI   Review of Systems     Objective:   Physical Exam BP 107/68  Pulse 77  Ht 5\' 3"  (1.6 m)  Wt 135 lb 9.6 oz (61.508 kg)  BMI 24.03 kg/m2  LMP 12/10/2013      Assessment:     ***    Plan:     ***

## 2013-12-25 NOTE — Progress Notes (Signed)
Patient ID: [name], female   DOB: 1975/07/12, 38 y.o.   MRN: [id number] Attestation of Attending Supervision of Resident: Evaluation and management procedures were [facility] under my supervision.  I have seen and examined the patient, reviewed the resident's note and chart, and I agree with the management and plan.  Anibal Hendersonarolyn L Harraway-Smith, M.D. 12/25/2013 5:02 PM

## 2013-12-25 NOTE — Progress Notes (Signed)
Patient reports that she is experiencing hotflashes, night sweats, and depression since having her tubal.

## 2014-03-02 ENCOUNTER — Encounter: Payer: Self-pay | Admitting: Obstetrics & Gynecology

## 2014-03-02 ENCOUNTER — Ambulatory Visit (INDEPENDENT_AMBULATORY_CARE_PROVIDER_SITE_OTHER): Payer: Medicaid Other | Admitting: Obstetrics & Gynecology

## 2014-03-02 VITALS — BP 102/75 | HR 64 | Temp 98.6°F | Ht 63.0 in | Wt 137.3 lb

## 2014-03-02 DIAGNOSIS — N920 Excessive and frequent menstruation with regular cycle: Secondary | ICD-10-CM

## 2014-03-02 DIAGNOSIS — N921 Excessive and frequent menstruation with irregular cycle: Secondary | ICD-10-CM

## 2014-03-02 LAB — CBC
HEMATOCRIT: 37.2 % (ref 36.0–46.0)
Hemoglobin: 12.8 g/dL (ref 12.0–15.0)
MCH: 31.2 pg (ref 26.0–34.0)
MCHC: 34.4 g/dL (ref 30.0–36.0)
MCV: 90.7 fL (ref 78.0–100.0)
PLATELETS: 190 10*3/uL (ref 150–400)
RBC: 4.1 MIL/uL (ref 3.87–5.11)
RDW: 13.2 % (ref 11.5–15.5)
WBC: 5.2 10*3/uL (ref 4.0–10.5)

## 2014-03-02 MED ORDER — MEGESTROL ACETATE 40 MG PO TABS: 40.0000 mg | ORAL_TABLET | Freq: Every day | ORAL | Status: DC

## 2014-03-02 NOTE — Progress Notes (Signed)
Subjective:     Patient ID: [name], female   DOB: 1975/09/13, 38 y.o.   MRN: [id number]  HPI Pt presents for request to have 'polyp removed' she reports that she was seen her ~ 2years ago and was diagnosed as having a 'polyp'  She is not aware of where it was but, reports that the provider noted easy bleeding when he touched the cervix.  She report bleeding after intercourse and heavy menstrual cycles lasting up to 10 days with bleeding between cycles as well.  She denies vaginal discharge or odor.   She reports pain with intercourse.    Review of Systems     Objective:   Physical Exam BP 102/75  Pulse 64  Temp(Src) 98.6 F (37 C)  Ht  (1.6 m)  Wt 137 lb 4.8 oz (62.279 kg)  BMI 24.33 kg/m2  LMP 02/13/2014 Pt in NAD Abd: soft, NT, ND GU: EGBUS: no lesions Vagina: no blood in vault Cervix: no lesion; no mucopurulent d/c; Nabothian cyst noted at 12-1:00 but it is flush with the cervix.  PAP obtained- cervx friable. Uterus: enlarged 10-12 weeks sized, mobile and tender to palpation Adnexa: no masses; non tender         Assessment:    menorrhagia and postcoital bleeding- suspect uterine fibroids or adenomyosis     Plan:     F/u PAP and cx TSH and CBC today (pt did not get TSH after last visit due to no insurance) Pelvic sono Megace  po q day F/y in 2-3 months or sooner prn

## 2014-03-02 NOTE — Patient Instructions (Signed)
Uterine Fibroid °A uterine fibroid is a growth (tumor) that occurs in your uterus. This type of tumor is not cancerous and does not spread out of the uterus. You can have one or many fibroids. Fibroids can vary in size, weight, and where they grow in the uterus. Some can become quite large. Most fibroids do not require medical treatment, but some can cause pain or heavy bleeding during and between periods. °CAUSES  °A fibroid is the result of a single uterine cell that keeps growing (unregulated), which is different than most cells in the human body. Most cells have a control mechanism that keeps them from reproducing without control.  °SIGNS AND SYMPTOMS  °· Bleeding. °· Pelvic pain and pressure. °· Bladder problems due to the size of the fibroid. °· Infertility and miscarriages depending on the size and location of the fibroid. °DIAGNOSIS  °Uterine fibroids are diagnosed through a physical exam. Your health care provider may feel the lumpy tumors during a pelvic exam. Ultrasonography may be done to get information regarding size, location, and number of tumors.  °TREATMENT  °· Your health care provider may recommend watchful waiting. This involves getting the fibroid checked by your health care provider to see if it grows or shrinks.   °· Hormone treatment or an intrauterine device (IUD) may be prescribed.   °· Surgery may be needed to remove the fibroids (myomectomy) or the uterus (hysterectomy). This depends on your situation. °When fibroids interfere with fertility and a woman wants to become pregnant, a health care provider may recommend having the fibroids removed.  °HOME CARE INSTRUCTIONS  °Home care depends on how you were treated. In general:  °· Keep all follow-up appointments with your health care provider.   °· Only take over-the-counter or prescription medicines as directed by your health care provider. If you were prescribed a hormone treatment, take the hormone medicines exactly as directed. Do not  take aspirin. It can cause bleeding.   °· Talk to your health care provider about taking iron pills. °· If your periods are troublesome but not so heavy, lie down with your feet raised slightly above your heart. Place cold packs on your lower abdomen.   °· If your periods are heavy, write down the number of pads or tampons you use per month. Bring this information to your health care provider.   °· Include green vegetables in your diet.   °SEEK IMMEDIATE MEDICAL CARE IF: °· You have pelvic pain or cramps not controlled with medicines.   °· You have a sudden increase in pelvic pain.   °· You have an increase in bleeding between and during periods.   °· You have excessive periods and soak tampons or pads in a half hour or less. °· You feel lightheaded or have fainting episodes. °Document Released: 06/16/2000 Document Revised: 04/09/2013 Document Reviewed: 01/16/2013 °ExitCare® Patient Information ©2015 [employer]. This information is not intended to replace advice given to you by your health care provider. Make sure you discuss any questions you have with your health care provider. ° °Menorrhagia °Menorrhagia is the medical term for when your menstrual periods are heavy or last longer than usual. With menorrhagia, every period you have may cause enough blood loss and cramping that you are unable to maintain your usual activities. °CAUSES  °In some cases, the cause of heavy periods is unknown, but a number of conditions may cause menorrhagia. Common causes include: °· A problem with the hormone-producing thyroid gland (hypothyroid). °· Noncancerous growths in the uterus (polyps or fibroids). °· An imbalance of   the estrogen and progesterone hormones. °· One of your ovaries not releasing an egg during one or more months. °· Side effects of having an intrauterine device (IUD). °· Side effects of some medicines, such as anti-inflammatory medicines or blood thinners. °· A bleeding disorder that stops your blood from  clotting normally. °SIGNS AND SYMPTOMS  °During a normal period, bleeding lasts between 4 and 8 days. Signs that your periods are too heavy include: °· You routinely have to change your pad or tampon every 1 or 2 hours because it is completely soaked. °· You pass blood clots larger than 1 inch (2.5 cm) in size. °· You have bleeding for more than 7 days. °· You need to use pads and tampons at the same time because of heavy bleeding. °· You need to wake up to change your pads or tampons during the night. °· You have symptoms of anemia, such as tiredness, fatigue, or shortness of breath.  °DIAGNOSIS  °Your health care provider will perform a physical exam and ask you questions about your symptoms and menstrual history. Other tests may be ordered based on what the health care provider finds during the exam. These tests can include: °· Blood tests. Blood tests are used to check if you are pregnant or have hormonal changes, a bleeding or thyroid disorder, low iron levels (anemia), or other problems. °· Endometrial biopsy. Your health care provider takes a sample of tissue from the inside of your uterus to be examined under a microscope. °· Pelvic ultrasound. This test uses sound waves to make a picture of your uterus, ovaries, and vagina. The pictures can show if you have fibroids or other growths. °· Hysteroscopy. For this test, your health care provider will use a small telescope to look inside your uterus. °Based on the results of your initial tests, your health care provider may recommend further testing. °TREATMENT  °Treatment may not be needed. If it is needed, your health care provider may recommend treatment with one or more medicines first. If these do not reduce bleeding enough, a surgical treatment might be an option. The best treatment for you will depend on:  °· Whether you need to prevent pregnancy.   °· Your desire to have children in the future. °· The cause and severity of your bleeding. °· Your opinion  and personal preference.   °Medicines for menorrhagia may include: °· Birth control methods that use hormones. These include the pill, skin patch, vaginal ring, shots that you get every 3 months, hormonal IUD, and implant. These treatments reduce bleeding during your menstrual period. °· Medicines that thicken blood and slow bleeding. °· Medicines that reduce swelling, such as ibuprofen.  °· Medicines that contain a synthetic hormone called progestin.   °· Medicines that make the ovaries stop working for a short time.   °You may need surgical treatment for menorrhagia if the medicines are unsuccessful. Treatment options include: °· Dilation and curettage (D&C). In this procedure, your health care provider opens (dilates) your cervix and then scrapes or suctions tissue from the lining of your uterus to reduce menstrual bleeding. °· Operative hysteroscopy. This procedure uses a tiny tube with a light (hysteroscope) to view your uterine cavity and can help in the surgical removal of a polyp that may be causing heavy periods. °· Endometrial ablation. Through various techniques, your health care provider permanently destroys the entire lining of your uterus (endometrium). After endometrial ablation, most women have little or no menstrual flow. Endometrial ablation reduces your ability to become pregnant. °· Endometrial resection.   This surgical procedure uses an electrosurgical wire loop to remove the lining of the uterus. This procedure also reduces your ability to become pregnant. °· Hysterectomy. Surgical removal of the uterus and cervix is a permanent procedure that stops menstrual periods. Pregnancy is not possible after a hysterectomy. This procedure requires anesthesia and hospitalization. °HOME CARE INSTRUCTIONS  °· Only take over-the-counter or prescription medicines as directed by your health care provider. Take prescribed medicines exactly as directed. Do not change or switch medicines without consulting your  health care provider. °· Take any prescribed iron pills exactly as directed by your health care provider. Long-term heavy bleeding may result in low iron levels. Iron pills help replace the iron your body lost from heavy bleeding. Iron may cause constipation. If this becomes a problem, increase the bran, fruits, and roughage in your diet. °· Do not take aspirin or medicines that contain aspirin 1 week before or during your menstrual period. Aspirin may make the bleeding worse. °· If you need to change your sanitary pad or tampon more than once every 2 hours, stay in bed and rest as much as possible until the bleeding stops. °· Eat well-balanced meals. Eat foods high in iron. Examples are leafy green vegetables, meat, liver, eggs, and whole grain breads and cereals. Do not try to lose weight until the abnormal bleeding has stopped and your blood iron level is back to normal. °SEEK MEDICAL CARE IF:  °· You soak through a pad or tampon every 1 or 2 hours, and this happens every time you have a period. °· You need to use pads and tampons at the same time because you are bleeding so much. °· You need to change your pad or tampon during the night. °· You have a period that lasts for more than 8 days. °· You pass clots bigger than 1 inch wide. °· You have irregular periods that happen more or less often than once a month. °· You feel dizzy or faint. °· You feel very weak or tired. °· You feel short of breath or feel your heart is beating too fast when you exercise. °· You have nausea and vomiting or diarrhea while you are taking your medicine. °· You have any problems that may be related to the medicine you are taking. °SEEK IMMEDIATE MEDICAL CARE IF:  °· You soak through 4 or more pads or tampons in 2 hours. °· You have any bleeding while you are pregnant. °MAKE SURE YOU:  °· Understand these instructions. °· Will watch your condition. °· Will get help right away if you are not doing well or get worse. °Document Released:  06/19/2005 Document Revised: 06/24/2013 Document Reviewed: 12/08/2012 °ExitCare® Patient Information ©2015 [employer]. This information is not intended to replace advice given to you by your health care provider. Make sure you discuss any questions you have with your health care provider. ° °

## 2014-03-03 ENCOUNTER — Telehealth: Payer: Self-pay

## 2014-03-03 LAB — CYTOLOGY - PAP

## 2014-03-03 LAB — TSH: TSH: 1.168 u[IU]/mL (ref 0.350–4.500)

## 2014-03-03 NOTE — Telephone Encounter (Signed)
Called patient and informed her of results and recommendations. Informed her pap results have not returned yet, however, we will call her if abnormal. Patient verbalized understanding. No questions or concerns.

## 2014-03-03 NOTE — Telephone Encounter (Signed)
Message copied by Louanna Raw on Tue Mar 03, 2014 11:09 AM ------      Message from: Willodean Rosenthal      Created: Tue Mar 03, 2014  9:47 AM       Please call pt. Her labs were all normal. Please f/u as instructed.              Thx,      clh-S ------

## 2014-03-11 ENCOUNTER — Ambulatory Visit (HOSPITAL_COMMUNITY)
Admission: RE | Admit: 2014-03-11 | Discharge: 2014-03-11 | Disposition: A | Discharge status: Home / Self Care | Payer: Medicaid Other | Source: Ambulatory Visit | Attending: Obstetrics & Gynecology | Admitting: Obstetrics & Gynecology

## 2014-03-11 DIAGNOSIS — N921 Excessive and frequent menstruation with irregular cycle: Secondary | ICD-10-CM

## 2014-03-11 DIAGNOSIS — N920 Excessive and frequent menstruation with regular cycle: Secondary | ICD-10-CM | POA: Insufficient documentation

## 2014-03-12 ENCOUNTER — Telehealth: Payer: Self-pay | Admitting: Obstetrics & Gynecology

## 2014-03-12 ENCOUNTER — Other Ambulatory Visit: Payer: Self-pay | Admitting: Obstetrics & Gynecology

## 2014-03-12 DIAGNOSIS — N921 Excessive and frequent menstruation with irregular cycle: Secondary | ICD-10-CM

## 2014-03-12 MED ORDER — MEGESTROL ACETATE 40 MG PO TABS: 40.0000 mg | ORAL_TABLET | Freq: Every day | ORAL | Status: DC

## 2014-03-12 NOTE — Telephone Encounter (Signed)
I spoke to pt and reviewed her US findings.  She reports that her bleeding is improved on the Megace.  She reports that it has increased her appetite. But, she is happy with her bleeding pattern on it.  Rec to continue the Megace for 6 months and f/u at that point.  Keandra Medero L. Harraway-Smith, M.D., [facility]

## 2014-05-04 ENCOUNTER — Encounter: Payer: Self-pay | Admitting: Obstetrics & Gynecology

## 2014-08-05 ENCOUNTER — Emergency Department (HOSPITAL_COMMUNITY)
Admission: EM | Admit: 2014-08-05 | Discharge: 2014-08-05 | Disposition: A | Discharge status: Home / Self Care | Payer: Self-pay | Attending: Emergency Medicine | Admitting: Emergency Medicine

## 2014-08-05 ENCOUNTER — Encounter (HOSPITAL_COMMUNITY): Payer: Self-pay

## 2014-08-05 DIAGNOSIS — Z79899 Other long term (current) drug therapy: Secondary | ICD-10-CM | POA: Insufficient documentation

## 2014-08-05 DIAGNOSIS — R51 Headache: Secondary | ICD-10-CM | POA: Insufficient documentation

## 2014-08-05 DIAGNOSIS — J029 Acute pharyngitis, unspecified: Secondary | ICD-10-CM | POA: Insufficient documentation

## 2014-08-05 DIAGNOSIS — H9209 Otalgia, unspecified ear: Secondary | ICD-10-CM | POA: Insufficient documentation

## 2014-08-05 DIAGNOSIS — Z862 Personal history of diseases of the blood and blood-forming organs and certain disorders involving the immune mechanism: Secondary | ICD-10-CM | POA: Insufficient documentation

## 2014-08-05 DIAGNOSIS — R630 Anorexia: Secondary | ICD-10-CM | POA: Insufficient documentation

## 2014-08-05 DIAGNOSIS — Z8742 Personal history of other diseases of the female genital tract: Secondary | ICD-10-CM | POA: Insufficient documentation

## 2014-08-05 DIAGNOSIS — R05 Cough: Secondary | ICD-10-CM | POA: Insufficient documentation

## 2014-08-05 DIAGNOSIS — R509 Fever, unspecified: Secondary | ICD-10-CM | POA: Insufficient documentation

## 2014-08-05 DIAGNOSIS — R0981 Nasal congestion: Secondary | ICD-10-CM | POA: Insufficient documentation

## 2014-08-05 DIAGNOSIS — M419 Scoliosis, unspecified: Secondary | ICD-10-CM | POA: Insufficient documentation

## 2014-08-05 DIAGNOSIS — R067 Sneezing: Secondary | ICD-10-CM | POA: Insufficient documentation

## 2014-08-05 DIAGNOSIS — Z720 Tobacco use: Secondary | ICD-10-CM | POA: Insufficient documentation

## 2014-08-05 DIAGNOSIS — R6889 Other general symptoms and signs: Secondary | ICD-10-CM

## 2014-08-05 LAB — RAPID STREP SCREEN (MED CTR MEBANE ONLY): Streptococcus, Group A Screen (Direct): NEGATIVE

## 2014-08-05 MED ORDER — MAGIC MOUTHWASH
10.0000 mL | Freq: Once | ORAL | Status: AC
  Administered 2014-08-05: 10 mL via ORAL
  Filled 2014-08-05: qty 10

## 2014-08-05 MED ORDER — MENTHOL 5.4 MG MT LOZG: 1.0000 | LOZENGE | Freq: Four times a day (QID) | OROMUCOSAL | Status: DC | PRN

## 2014-08-05 MED ORDER — GUAIFENESIN 100 MG/5ML PO LIQD: 100.0000 mg | ORAL | Status: DC | PRN

## 2014-08-05 NOTE — Discharge Instructions (Signed)

## 2014-08-05 NOTE — ED Provider Notes (Signed)
CSN: [id number]     Arrival date & time 08/05/14  1208 History   First MD Initiated Contact with Patient 08/05/14 1427     Chief Complaint  Patient presents with  . Generalized Body Aches     (Consider location/radiation/quality/duration/timing/severity/associated sxs/prior Treatment) HPI   39 year old female presents with flulike symptoms. Patient reports for the past 3-4 days she has been feeling sick. Endorse subjective fever, headache, nasal congestion, ear pain, sore throat, sneezing, cough, body aches, and decrease in appetite. Symptoms not improving despite taking Sudafed at home. No complaint of vision changes, chest pain, shortness of breath, abdominal pain, nausea vomiting diarrhea, or dysuria. No numbness weakness or rash. Her godson's recently diagnosed with strep infection. Patient is a smoker.  Past Medical History  Diagnosis Date  . Anemia   . Sarcoidosis of lung 2004  . Polyp, uterus corpus   . Scoliosis    Past Surgical History  Procedure Laterality Date  . Ovarian cyst removal  1995  . Hernia repair  1996    Left Inguinal  . Cholecystectomy  1997  . Tubal ligation     Family History  Problem Relation Age of Onset  . Hypertension Mother   . Cancer Maternal Uncle     bone cancer, liver cancer  . Cancer Maternal Grandmother     lung cancer  . Hypertension Maternal Grandmother    History  Substance Use Topics  . Smoking status: Current Every Day Smoker -- 0.50 packs/day for 5 years    Types: Cigarettes  . Smokeless tobacco: Never Used  . Alcohol Use: Yes     Comment: occasional   OB History    Gravida Para Term Preterm AB TAB SAB Ectopic Multiple Living   0 0 3     Review of Systems  All other systems reviewed and are negative.     Allergies  Aspirin; Hydrocodone; and Orange concentrate  Home Medications   Prior to Admission medications   Medication Sig Start Date End Date Taking? Authorizing Provider  megestrol (MEGACE) 40 MG  tablet Take 1 tablet (40 mg total) by mouth daily. 03/12/14   Willodean Rosenthal, MD   BP 110/63 mmHg  Pulse 81  Temp(Src) 99.1 F (37.3 C) (Oral)  Resp 16  Ht  (1.6 m)  Wt 141 lb (63.957 kg)  BMI 24.98 kg/m2  SpO2 97%  LMP  (LMP Unknown) Physical Exam  Constitutional: She is oriented to person, place, and time. She appears well-developed and well-nourished. No distress.  African-American female appears to be in no acute distress, nontoxic in appearance.  HENT:  Head: Atraumatic.  Right Ear: External ear normal.  Left Ear: External ear normal.  Throat: Uvula is midline, posterior oropharyngeal erythema without tonsillar enlargement or exudates. No trismus noted. No evidence of deep tissue infection.  Eyes: Conjunctivae are normal.  Neck: Neck supple.  Cardiovascular: Normal rate and regular rhythm.   Pulmonary/Chest: Effort normal and breath sounds normal.  Abdominal: Soft. There is no tenderness.  Neurological: She is alert and oriented to person, place, and time.  Skin: No rash noted.  Psychiatric: She has a normal mood and affect.  Nursing note and vitals reviewed.   ED Course  Procedures (including critical care time)  2:41 PM Patient with flulike symptoms. She is afebrile with stable normal vital sign. Will obtain strep test as patient has recent strep exposure.  Labs Review Labs Reviewed  RAPID STREP SCREEN  CULTURE,  GROUP A STREP    Imaging Review No results found.   EKG Interpretation None      MDM   Final diagnoses:  Flu-like symptoms    BP 103/73 mmHg  Pulse 65  Temp(Src) 99.1 F (37.3 C) (Oral)  Resp 16  Ht 5\' 3"  (1.6 m)  Wt 141 lb (63.957 kg)  BMI 24.98 kg/m2  SpO2 99%  LMP  (LMP Unknown)     Fayrene HelperBowie Rasha Ibe, PA-C 08/05/14 1615  Glynn OctaveStephen Rancour, MD 08/05/14 1710

## 2014-08-05 NOTE — ED Notes (Signed)
Pt reports feeling sick since Sunday. Reporting generalized body aches, congestion, sore throat, headache.  Pt sts she took sudafed at home with no relief.  Reports chills at home.

## 2014-08-07 LAB — CULTURE, GROUP A STREP

## 2015-05-14 ENCOUNTER — Emergency Department (HOSPITAL_COMMUNITY): Payer: BLUE CROSS/BLUE SHIELD

## 2015-05-14 ENCOUNTER — Encounter (HOSPITAL_COMMUNITY): Payer: Self-pay | Admitting: *Deleted

## 2015-05-14 ENCOUNTER — Observation Stay (HOSPITAL_COMMUNITY)
Admission: EM | Admit: 2015-05-14 | Discharge: 2015-05-18 | Disposition: A | Discharge status: Home / Self Care | Payer: BLUE CROSS/BLUE SHIELD | Attending: Internal Medicine | Admitting: Internal Medicine

## 2015-05-14 DIAGNOSIS — D869 Sarcoidosis, unspecified: Secondary | ICD-10-CM | POA: Diagnosis not present

## 2015-05-14 DIAGNOSIS — R531 Weakness: Principal | ICD-10-CM | POA: Insufficient documentation

## 2015-05-14 DIAGNOSIS — F17210 Nicotine dependence, cigarettes, uncomplicated: Secondary | ICD-10-CM | POA: Diagnosis not present

## 2015-05-14 DIAGNOSIS — M419 Scoliosis, unspecified: Secondary | ICD-10-CM | POA: Insufficient documentation

## 2015-05-14 DIAGNOSIS — N3941 Urge incontinence: Secondary | ICD-10-CM | POA: Insufficient documentation

## 2015-05-14 DIAGNOSIS — R937 Abnormal findings on diagnostic imaging of other parts of musculoskeletal system: Secondary | ICD-10-CM | POA: Diagnosis present

## 2015-05-14 DIAGNOSIS — G9520 Unspecified cord compression: Secondary | ICD-10-CM

## 2015-05-14 DIAGNOSIS — R278 Other lack of coordination: Secondary | ICD-10-CM | POA: Diagnosis not present

## 2015-05-14 DIAGNOSIS — R29898 Other symptoms and signs involving the musculoskeletal system: Secondary | ICD-10-CM

## 2015-05-14 DIAGNOSIS — D649 Anemia, unspecified: Secondary | ICD-10-CM | POA: Diagnosis not present

## 2015-05-14 DIAGNOSIS — H53129 Transient visual loss, unspecified eye: Secondary | ICD-10-CM | POA: Insufficient documentation

## 2015-05-14 DIAGNOSIS — M79604 Pain in right leg: Secondary | ICD-10-CM | POA: Insufficient documentation

## 2015-05-14 DIAGNOSIS — M79605 Pain in left leg: Secondary | ICD-10-CM | POA: Diagnosis not present

## 2015-05-14 DIAGNOSIS — H539 Unspecified visual disturbance: Secondary | ICD-10-CM

## 2015-05-14 DIAGNOSIS — R262 Difficulty in walking, not elsewhere classified: Secondary | ICD-10-CM | POA: Insufficient documentation

## 2015-05-14 DIAGNOSIS — Z79899 Other long term (current) drug therapy: Secondary | ICD-10-CM | POA: Insufficient documentation

## 2015-05-14 DIAGNOSIS — M79606 Pain in leg, unspecified: Secondary | ICD-10-CM | POA: Diagnosis present

## 2015-05-14 DIAGNOSIS — R269 Unspecified abnormalities of gait and mobility: Secondary | ICD-10-CM | POA: Insufficient documentation

## 2015-05-14 LAB — URINALYSIS, ROUTINE W REFLEX MICROSCOPIC
Bilirubin Urine: NEGATIVE
GLUCOSE, UA: NEGATIVE mg/dL
KETONES UR: NEGATIVE mg/dL
LEUKOCYTES UA: NEGATIVE
Nitrite: NEGATIVE
PH: 6.5 (ref 5.0–8.0)
Protein, ur: NEGATIVE mg/dL
Specific Gravity, Urine: 1.008 (ref 1.005–1.030)
Urobilinogen, UA: 1 mg/dL (ref 0.0–1.0)

## 2015-05-14 LAB — CBC WITH DIFFERENTIAL/PLATELET
BASOS ABS: 0 10*3/uL (ref 0.0–0.1)
Basophils Relative: 0 %
EOS PCT: 1 %
Eosinophils Absolute: 0 10*3/uL (ref 0.0–0.7)
HEMATOCRIT: 35.5 % — AB (ref 36.0–46.0)
Hemoglobin: 11.8 g/dL — ABNORMAL LOW (ref 12.0–15.0)
Lymphocytes Relative: 45 %
Lymphs Abs: 1.7 10*3/uL (ref 0.7–4.0)
MCH: 30.3 pg (ref 26.0–34.0)
MCHC: 33.2 g/dL (ref 30.0–36.0)
MCV: 91.3 fL (ref 78.0–100.0)
Monocytes Absolute: 0.3 10*3/uL (ref 0.1–1.0)
Monocytes Relative: 8 %
NEUTROS ABS: 1.8 10*3/uL (ref 1.7–7.7)
Neutrophils Relative %: 46 %
PLATELETS: 213 10*3/uL (ref 150–400)
RBC: 3.89 MIL/uL (ref 3.87–5.11)
RDW: 12.7 % (ref 11.5–15.5)
WBC: 3.8 10*3/uL — AB (ref 4.0–10.5)

## 2015-05-14 LAB — BASIC METABOLIC PANEL
ANION GAP: 6 (ref 5–15)
BUN: 11 mg/dL (ref 6–20)
CO2: 23 mmol/L (ref 22–32)
CREATININE: 0.93 mg/dL (ref 0.44–1.00)
Calcium: 8.9 mg/dL (ref 8.9–10.3)
Chloride: 108 mmol/L (ref 101–111)
Glucose, Bld: 84 mg/dL (ref 65–99)
Potassium: 4.1 mmol/L (ref 3.5–5.1)
Sodium: 137 mmol/L (ref 135–145)

## 2015-05-14 LAB — URINE MICROSCOPIC-ADD ON

## 2015-05-14 LAB — I-STAT BETA HCG BLOOD, ED (MC, WL, AP ONLY): I-stat hCG, quantitative: <5 m[IU]/mL (ref <5)

## 2015-05-14 LAB — CK: CK TOTAL: 129 U/L (ref 38–234)

## 2015-05-14 MED ORDER — SODIUM CHLORIDE 0.9 % IV BOLUS (SEPSIS)
500.0000 mL | Freq: Once | INTRAVENOUS | Status: AC
  Administered 2015-05-14: 500 mL via INTRAVENOUS

## 2015-05-14 MED ORDER — LORAZEPAM 2 MG/ML IJ SOLN
1.0000 mg | Freq: Once | INTRAMUSCULAR | Status: AC
  Administered 2015-05-14: 1 mg via INTRAVENOUS
  Filled 2015-05-14: qty 1

## 2015-05-14 MED ORDER — SODIUM CHLORIDE 0.9 % IV SOLN
INTRAVENOUS | Status: DC
  Administered 2015-05-15 – 2015-05-18 (×3): via INTRAVENOUS

## 2015-05-14 NOTE — ED Notes (Signed)
Bed: WA27 Expected date:  Expected time:  Means of arrival:  Comments: Hold for rm 17

## 2015-05-14 NOTE — ED Provider Notes (Signed)
Sedation presents with difficulty walking upon awakening this morning. She states she was walking normally yesterday. She does suffer from chronic bilateral leg pain. On exam gait is unsteady and shuffling. DTRs are symmetric bilaterally at knee jerk and ankle jerk bilaterally toes downward going bilaterally. DP pulses 2+ bilaterally  Doug SouSam Shar Paez, MD 05/15/15 0124

## 2015-05-14 NOTE — ED Notes (Signed)
Patient transported to MRI 

## 2015-05-14 NOTE — H&P (Signed)
[facility] History and Physical  JAMILEX BOHNSACK ZOX:[id number] DOB: 1975-11-08 DOA: 05/14/2015  PCP: No PCP Per Patient   Chief Complaint: Gait disturbance and transient vision loss  HPI: [name] is a 39 y.o. woman with a history of sarcoidosis, scoliosis, and chronic anemia who presents to the ED complaining of multiple new onset neurological symptoms.  She feels that she was in her baseline state of health when she went to bed last night.  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  She is accustomed to intermittent pain in her legs and feet at the end of the day.  However, she awakened this AM and had an episode of transient vision loss ("it was like some turned the light out").  She subsequently noted gait disturbance and weakness, left side greater than right.  She has a history of headaches, and does not relate headache to her symptoms today.  No fever, chills, or sweats, but she says "I am always cold".  She has had some light-headedness but no syncope.  No lower urinary tract symptoms.  No recent travel, bites, or new exposures.  She was referred for MRI of her lumbar and thoracic spine in the ED, and she has abnormal findings involving her cord at T9.  Hospitalist asked to facilitate transfer to [facility] for neurology evaluation.  Neuro-hospitalist was consulted by ED personnel and will see the patient upon arrival.  Review of Systems: 12 systems reviewed and negative except as stated in HPI.  Past Medical History  Diagnosis Date  . Anemia   . Sarcoidosis of lung (HCC) 2004  . Polyp, uterus corpus   . Scoliosis    Past Surgical History  Procedure Laterality Date  . Ovarian cyst removal  1995  . Hernia repair  1996    Left Inguinal  . Cholecystectomy  1997  . 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 Aspirin     REACTION: GI upset  . Hydrocodone     REACTION: Chest pain  . Orange Concentrate [Flavoring Agent] Rash    Family History  Problem Relation Age of Onset  . Hypertension Mother   . Cancer Maternal Uncle     bone cancer, liver cancer  . Cancer Maternal Grandmother     lung cancer  . Hypertension Maternal Grandmother    Prior to Admission medications   Medication Sig Start Date End Date Taking? Authorizing Provider  guaiFENesin (ROBITUSSIN) 100 MG/5ML liquid Take 5-10 mLs (100-200 mg total) by mouth every 4 (four) hours as needed for cough. Patient not taking: Reported on 05/14/2015 08/05/14   Fayrene Helper, PA-C  megestrol (MEGACE) 40 MG tablet Take 1 tablet (40 mg total) by mouth daily. Patient not taking: Reported on 05/14/2015 03/12/14   Willodean Rosenthal, MD  Menthol (CEPACOL SORE THROAT) 5.4 MG LOZG Use as directed 1 lozenge (5.4 mg total) in the mouth or throat every 6 (six) hours as needed. Patient not taking: Reported on 05/14/2015 08/05/14   Fayrene Helper, PA-C   Physical Exam: Filed Vitals:   05/14/15 1356 05/14/15 2100  BP: 103/66 91/67  Pulse: 71 70  Temp: 98.8 F (37.1 C)  TempSrc: Oral   Resp: 16 17  SpO2: 97% 100%     General:  Sleeping but arousable.  NAD.  Eyes: PERRL bilaterally  ENT: no drainage, mucous membranes slightly dry  Neck: supple, no carotid bruit  Cardiovascular: NR/RR  Respiratory: CTA bilaterally  Abdomen: Soft, nontender, nondistended, bowel sounds are present  Skin: Warm and dry  Musculoskeletal: Moves all four extremities spontaneously but "delayed" responses with left leg  Psychiatric: Affect appropriate  Neurologic: CN grossly intact, coordination grossly intact, left lower extremity weakness.  I did not ambulate the patient.  Labs on Admission:  Basic Metabolic Panel:  Recent Labs Lab 05/14/15 1548  NA 137  K  4.1  CL 108  CO2 23  GLUCOSE 84  BUN 11  CREATININE 0.93  CALCIUM 8.9   CBC:  Recent Labs Lab 05/14/15 1548  WBC 3.8*  NEUTROABS 1.8  HGB 11.8*  HCT 35.5*  MCV 91.3  PLT 213   Cardiac Enzymes:  Recent Labs Lab 05/14/15 1548  CKTOTAL 129   Radiological Exams on Admission: Mr Thoracic Spine Wo Contrast  05/14/2015  CLINICAL DATA:  39 year old female complains of diffuse moderate BILATERAL leg pain and weakness beginning earlier today. Initial encounter. EXAM: MRI THORACIC AND LUMBAR SPINE WITHOUT CONTRAST TECHNIQUE: Multiplanar and multiecho pulse sequences of the thoracic and lumbar spine were obtained without intravenous contrast. COMPARISON:  CT chest 10/22/2007. FINDINGS: MR THORACIC SPINE FINDINGS Numbering of the thoracic spine was performed by counting down from the odontoid. There is a T8 hemivertebra, contributing to a moderate degree of dextroconvex scoliosis centered at that level. The cord appears stretched over T7 and T8 related not only to dextroscoliosis but increased kyphosis. At the T9 level, the cord is abnormal. Axial images demonstrate two adjacent and fairly symmetric areas of cystic myelomalacia with associated with dorsal cord hyperintensity likely representing chronic gliosis. 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 I am unable to exclude an acute cord injury, chronic ischemic change, or even another unrelated process such as tumor or infection. Post infusion imaging is recommended. No thoracic disc protrusion or spinal stenosis. No intraspinal mass lesion. Paravertebral soft tissues are unremarkable. MR LUMBAR SPINE FINDINGS No disc protrusion or spinal stenosis. No vertebral body abnormality. Mild lower lumbar facet arthropathy, worst at L4-5, is noncompressive. No spinal stenosis or nerve root impingement. Paravertebral soft tissues unremarkable. IMPRESSION: MR THORACIC SPINE  IMPRESSION Chronic T8 hemivertebra, contributing to a moderate degree of dextroconvex scoliosis and increased thoracic kyphosis. 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 More dorsally the cord is hyperintense but does not show cystic change. Chronic ischemia, cystic neoplasm, vascular malformation, or unusual infection is less favored. Post infusion imaging of the thoracic spine, with both coronal T2 and sagittal and axial postcontrast T1 weighted imaging is recommended for further evaluation. It is unclear if the T9 lesion is responsible for the patient's acute leg pain and weakness. MR LUMBAR SPINE IMPRESSION Unremarkable lumbar spine MRI. Electronically Signed   By: Elsie Stain M.D.   On: 05/14/2015 20:21   Mr Lumbar Spine Wo Contrast  05/14/2015  CLINICAL DATA:  39 year old female complains of diffuse moderate BILATERAL leg pain and weakness beginning earlier today. Initial encounter. EXAM: MRI THORACIC AND LUMBAR SPINE WITHOUT CONTRAST TECHNIQUE: Multiplanar and multiecho pulse sequences of the thoracic and lumbar spine were obtained without intravenous contrast. COMPARISON:  CT chest 10/22/2007. FINDINGS: MR THORACIC SPINE FINDINGS Numbering of the thoracic spine was performed by counting down from the odontoid. There is a T8 hemivertebra, contributing to a moderate degree of dextroconvex scoliosis centered at that level. The cord appears stretched over T7 and T8 related not only to dextroscoliosis but increased kyphosis. At the T9 level, the cord is abnormal. Axial images demonstrate two adjacent and fairly symmetric areas of cystic myelomalacia with associated with dorsal cord hyperintensity likely representing chronic gliosis. 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 I am unable to exclude an acute cord injury,  chronic ischemic change, or even another unrelated process such as tumor or infection. Post infusion imaging is recommended. No thoracic disc protrusion or spinal stenosis. No intraspinal mass lesion. Paravertebral soft tissues are unremarkable. MR LUMBAR SPINE FINDINGS No disc protrusion or spinal stenosis. No vertebral body abnormality. Mild lower lumbar facet arthropathy, worst at L4-5, is noncompressive. No spinal stenosis or nerve root impingement. Paravertebral soft tissues unremarkable. IMPRESSION: MR THORACIC SPINE IMPRESSION Chronic T8 hemivertebra, contributing to a moderate degree of dextroconvex scoliosis and increased thoracic kyphosis. 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 More dorsally the cord is hyperintense but does not show cystic change. Chronic ischemia, cystic neoplasm, vascular malformation, or unusual infection is less favored. Post infusion imaging of the thoracic spine, with both coronal T2 and sagittal and axial postcontrast T1 weighted imaging is recommended for further evaluation. It is unclear if the T9 lesion is responsible for the patient's acute leg pain and weakness. MR LUMBAR SPINE IMPRESSION Unremarkable lumbar spine MRI. Electronically Signed   By: Elsie StainJohn T Curnes M.D.   On: 05/14/2015 20:21    Assessment/Plan Active Problems:   Abnormal MRI, thoracic spine   Weakness   Chronic anemia   1. [facility]  2.  Abnormal MRI, thoracic spine --Neurology consult pending.  Suspect she will need additional imaging and possibly lumbar puncture.  Defer to specialist. --Will add hepatic function panel, thyroid function tests, and coags to her admission labs  3.  Relative hypotension --NS bolus and maintenance fluids for transfer  Code Status: FULL Family Communication: Patient alone Disposition Plan: To be determined  Time spent: 50  minutes  [employer] BrandsCarter,Bear Osten Harrison [facility]  05/14/2015, 11:31 PM

## 2015-05-14 NOTE — ED Provider Notes (Signed)
[facility] [place], pending MRI thoracic and lumbar spine in evaluation of bilateral LE weakness. She reports daily pain in LE's without weakness secondary to h/o scoliosis. She has a history of sarcoidosis as well, on no regular medications. No urinary or bowel incontinence but she does endorse increased urinary urgency where she sometimes does not make it to the bathroom before releasing some urine - never the full bladder. No numbness. No falls.  MRI thoracic spine showing abnormality with recommendation for further weighted imaging. Discussed with Dr. Roseanne RenoStewart who reviewed the MR and recommended the further work up be performed. It was agreed the patient would be admitted to medicine, [facility] she would be evaluated by Dr. Roseanne RenoStewart. Discussed with [facility] who would facilitate transfer/admission. Patient and husband agree with care plan. All questions answered.  Elpidio AnisShari Akito Boomhower, PA-C 05/14/15 2114  Doug SouSam Jacubowitz, MD 05/15/15 [phone]

## 2015-05-14 NOTE — ED Notes (Signed)
Patient called 911 today stating that her legs and feet hurt. She was ambulatory on scene with stable stable . Patient denies injury.

## 2015-05-14 NOTE — ED Provider Notes (Signed)
CSN: [id number]     Arrival date & time 05/14/15  1351 History  By signing my name below, I, [name], attest that this documentation has been prepared under the direction and in the presence of Renne Crigler, PA-C. Electronically Signed: Placido [name], ED Scribe. 05/14/2015. 2:46 PM.   Chief Complaint  Patient presents with  . Leg Pain   The history is provided by the patient and a parent. No language interpreter was used.   HPI Comments: [name] is a 39 y.o. female with a history of chronic pain in her legs and ? Epidural abscess -- who presents to the Emergency Department by ambulance complaining of diffuse, moderate, bilateral leg pain and weakness with onset this morning. Pt notes that she was lying down and experienced a sudden, complete, loss of vision which she says lasted for 5-10 seconds. She says she then went to sleep and upon waking began feeling her bilateral leg pain and further notes associated weakness and paraesthesia in her bilateral legs and trouble ambulating resulting in a fall when exiting the shower. Pt states "it's like something in my body isn't working right". She notes a hx of sarcoidosis in her lungs and was prescribed a steroid for treatment which she notes another provider said caused deterioration of the muscles in her legs. Her mother notes a hx of scoliosis and spinal fracture. Pt denies a hx of sudden vision loss. She denies any head trauma, LOC, arm weakness, facial droop and slurred speech. Patient denies warning symptoms of back pain including: fecal incontinence, urinary retention or overflow incontinence, night sweats, waking from sleep with back pain, unexplained fevers or weight loss, h/o cancer, IVDU, recent trauma.     She also notes a mild HA with onset 2 weeks ago. Her mother notes that this is a chronic condition and pt confirms taking Excedrin as needed for pain management. Pt denies taking any medications regularly. She denies any  other associated symptoms.   Past Medical History  Diagnosis Date  . Anemia   . Sarcoidosis of lung (HCC) 2004  . Polyp, uterus corpus   . Scoliosis    Past Surgical History  Procedure Laterality Date  . Ovarian cyst removal  1995  . Hernia repair  1996    Left Inguinal  . Cholecystectomy  1997  . Tubal ligation     Family History  Problem Relation Age of Onset  . Hypertension Mother   . Cancer Maternal Uncle     bone cancer, liver cancer  . Cancer Maternal Grandmother     lung cancer  . Hypertension Maternal Grandmother    Social History  Substance Use Topics  . Smoking status: Current Every Day Smoker -- 0.50 packs/day for 5 years    Types: Cigarettes  . Smokeless tobacco: Never Used  . Alcohol Use: Yes     Comment: occasional   OB History    Gravida Para Term Preterm AB TAB SAB Ectopic Multiple Living   0 0 3     Review of Systems  Constitutional: Negative for fever.  HENT: Negative for congestion, dental problem, rhinorrhea and sinus pressure.   Eyes: Positive for visual disturbance. Negative for photophobia, discharge and redness.  Respiratory: Negative for shortness of breath.   Cardiovascular: Negative for chest pain.  Gastrointestinal: Negative for nausea and vomiting.  Musculoskeletal: Positive for myalgias, back pain, arthralgias and gait problem. Negative for neck pain and neck stiffness.  Skin:  Negative for rash and wound.  Neurological: Positive for weakness and headaches. Negative for syncope, facial asymmetry, speech difficulty, light-headedness and numbness.  Psychiatric/Behavioral: Negative for confusion.   Allergies  Aspirin; Hydrocodone; and Orange concentrate  Home Medications   Prior to Admission medications   Medication Sig Start Date End Date Taking? Authorizing Provider  guaiFENesin (ROBITUSSIN) 100 MG/5ML liquid Take 5-10 mLs (100-200 mg total) by mouth every 4 (four) hours as needed for cough. 08/05/14   Fayrene Helper, PA-C   megestrol (MEGACE) 40 MG tablet Take 1 tablet (40 mg total) by mouth daily. 03/12/14   Willodean Rosenthal, MD  Menthol (CEPACOL SORE THROAT) 5.4 MG LOZG Use as directed 1 lozenge (5.4 mg total) in the mouth or throat every 6 (six) hours as needed. 08/05/14   Fayrene Helper, PA-C   BP 103/66 mmHg  Pulse 71  Temp(Src) 98.8 F (37.1 C) (Oral)  Resp 16  SpO2 97%   Physical Exam  Constitutional: She is oriented to person, place, and time. She appears well-developed and well-nourished.  HENT:  Head: Normocephalic and atraumatic.  Right Ear: Tympanic membrane, external ear and ear canal normal.  Left Ear: Tympanic membrane, external ear and ear canal normal.  Nose: Nose normal.  Mouth/Throat: Uvula is midline, oropharynx is clear and moist and mucous membranes are normal.  Eyes: Conjunctivae, EOM and lids are normal. Pupils are equal, round, and reactive to light. Right eye exhibits no nystagmus. Left eye exhibits no nystagmus.  Neck: Normal range of motion. Neck supple.  Cardiovascular: Normal rate and regular rhythm.   Pulmonary/Chest: Effort normal and breath sounds normal.  Abdominal: Soft. There is no tenderness.  Musculoskeletal:       Cervical back: She exhibits normal range of motion, no tenderness and no bony tenderness.       Thoracic back: She exhibits tenderness. She exhibits normal range of motion and no bony tenderness.       Lumbar back: She exhibits tenderness. She exhibits normal range of motion and no bony tenderness.  Neurological: She is alert and oriented to person, place, and time. She has normal strength and normal reflexes. No cranial nerve deficit or sensory deficit. She displays a negative Romberg sign. Gait abnormal. Coordination normal. GCS eye subscore is 4. GCS verbal subscore is 5. GCS motor subscore is 6.  Patient able to ambulate but does so tentatively looking down her feet and holding onto objects. Normal proprioception in upper and lower extremities.  Skin:  Skin is warm and dry.  Psychiatric: She has a normal mood and affect.  Nursing note and vitals reviewed.   ED Course  Procedures  DIAGNOSTIC STUDIES: Oxygen Saturation is 97% on RA, normal by my interpretation.    COORDINATION OF CARE: 2:41 PM Pt presents today with multiple complaints including, weakness, bilateral leg pain and parasthesia. Discussed next steps with pt at bedside. Pt agreed to plan.  Patient discussed with and seen by Dr. Madilyn Hook. Plan is for syncope type work-up and MRI of thoracic and lumbar spine.  Labs Review Labs Reviewed  CBC WITH DIFFERENTIAL/PLATELET - Abnormal; Notable for the following:    WBC 3.8 (*)    Hemoglobin 11.8 (*)    HCT 35.5 (*)    All other components within normal limits  URINALYSIS, ROUTINE W REFLEX MICROSCOPIC (NOT AT [facility]) - Abnormal; Notable for the following:    Hgb urine dipstick SMALL (*)    All other components within normal limits  BASIC METABOLIC PANEL  CK  URINE MICROSCOPIC-ADD ON  I-STAT BETA HCG BLOOD, ED ([facility], [facility], [facility] ONLY)    Imaging Review No results found. I have personally reviewed and evaluated these images and lab results as part of my medical decision-making.   8:08 PM MRI completed. Pending results.   Handoff to [place] PA-C at shift change.   If neg, will ambulate and d/c to home with neuro and PCP referral.  MDM   Final diagnoses:  Leg weakness, bilateral  Vision disturbance   Pending completion of work-up. Unclear etiology of vision disturbance, however patient does not have any other focal neurological signs and symptoms of stroke. Patient is weak her bilateral legs with some back pain. For this reason, MRI was ordered. Patient is able to ambulate but not at her baseline and is very tentative.  Remainder of workup was unremarkable. Mild anemia noted normal electrolytes and normal CK. Urine normal. EKG was shown to me (not crossing over in Epic) -- normal rate and sinus rhythm.   I personally performed  the services described in this documentation, which was scribed in my presence. The recorded information has been reviewed and is accurate.    Renne CriglerJoshua Gladyes Kudo, PA-C 05/14/15 2018  Doug SouSam Jacubowitz, MD 05/15/15 [phone]

## 2015-05-15 ENCOUNTER — Observation Stay (HOSPITAL_COMMUNITY): Payer: BLUE CROSS/BLUE SHIELD

## 2015-05-15 DIAGNOSIS — D869 Sarcoidosis, unspecified: Secondary | ICD-10-CM | POA: Diagnosis not present

## 2015-05-15 DIAGNOSIS — R29898 Other symptoms and signs involving the musculoskeletal system: Secondary | ICD-10-CM | POA: Diagnosis not present

## 2015-05-15 DIAGNOSIS — R278 Other lack of coordination: Secondary | ICD-10-CM | POA: Diagnosis not present

## 2015-05-15 DIAGNOSIS — R937 Abnormal findings on diagnostic imaging of other parts of musculoskeletal system: Secondary | ICD-10-CM | POA: Diagnosis not present

## 2015-05-15 DIAGNOSIS — R531 Weakness: Secondary | ICD-10-CM | POA: Diagnosis not present

## 2015-05-15 LAB — VITAMIN B12: Vitamin B-12: 335 pg/mL (ref 180–914)

## 2015-05-15 LAB — CBC
HCT: 32.1 % — ABNORMAL LOW (ref 36.0–46.0)
Hemoglobin: 10.6 g/dL — ABNORMAL LOW (ref 12.0–15.0)
MCH: 30 pg (ref 26.0–34.0)
MCHC: 33 g/dL (ref 30.0–36.0)
MCV: 90.9 fL (ref 78.0–100.0)
PLATELETS: 212 10*3/uL (ref 150–400)
RBC: 3.53 MIL/uL — AB (ref 3.87–5.11)
RDW: 12.6 % (ref 11.5–15.5)
WBC: 4 10*3/uL (ref 4.0–10.5)

## 2015-05-15 LAB — BASIC METABOLIC PANEL
Anion gap: 6 (ref 5–15)
BUN: 14 mg/dL (ref 6–20)
CALCIUM: 8.7 mg/dL — AB (ref 8.9–10.3)
CO2: 23 mmol/L (ref 22–32)
Chloride: 109 mmol/L (ref 101–111)
Creatinine, Ser: 0.97 mg/dL (ref 0.44–1.00)
GFR calc Af Amer: >60 mL/min (ref >60)
GLUCOSE: 124 mg/dL — AB (ref 65–99)
Potassium: 4 mmol/L (ref 3.5–5.1)
SODIUM: 138 mmol/L (ref 135–145)

## 2015-05-15 LAB — FOLATE: Folate: 8.6 ng/mL (ref >5.9)

## 2015-05-15 LAB — HEPATIC FUNCTION PANEL
ALK PHOS: 55 U/L (ref 38–126)
ALT: 12 U/L — AB (ref 14–54)
AST: 19 U/L (ref 15–41)
Albumin: 3.6 g/dL (ref 3.5–5.0)
BILIRUBIN DIRECT: 0.2 mg/dL (ref 0.1–0.5)
BILIRUBIN INDIRECT: 0.4 mg/dL (ref 0.3–0.9)
BILIRUBIN TOTAL: 0.6 mg/dL (ref 0.3–1.2)
Total Protein: 6.2 g/dL — ABNORMAL LOW (ref 6.5–8.1)

## 2015-05-15 LAB — TSH: TSH: 1.514 u[IU]/mL (ref 0.350–4.500)

## 2015-05-15 LAB — PROTIME-INR
INR: 1.28 (ref 0.00–1.49)
Prothrombin Time: 16.2 seconds — ABNORMAL HIGH (ref 11.6–15.2)

## 2015-05-15 LAB — T4, FREE: Free T4: 1 ng/dL (ref 0.61–1.12)

## 2015-05-15 MED ORDER — ACETAMINOPHEN 325 MG PO TABS
650.0000 mg | ORAL_TABLET | Freq: Four times a day (QID) | ORAL | Status: DC | PRN
  Administered 2015-05-15 (×3): 325 mg via ORAL
  Administered 2015-05-16 – 2015-05-18 (×6): 650 mg via ORAL
  Filled 2015-05-15 (×8): qty 2

## 2015-05-15 MED ORDER — SODIUM CHLORIDE 0.9 % IJ SOLN
3.0000 mL | Freq: Two times a day (BID) | INTRAMUSCULAR | Status: DC
  Administered 2015-05-15 – 2015-05-16 (×4): 3 mL via INTRAVENOUS

## 2015-05-15 MED ORDER — LORAZEPAM 2 MG/ML IJ SOLN
1.0000 mg | Freq: Once | INTRAMUSCULAR | Status: AC
  Administered 2015-05-15: 1 mg via INTRAVENOUS
  Filled 2015-05-15: qty 1

## 2015-05-15 MED ORDER — ACETAMINOPHEN 650 MG RE SUPP
650.0000 mg | Freq: Four times a day (QID) | RECTAL | Status: DC | PRN
Start: 2015-05-15 — End: 2015-05-18
  Filled 2015-05-15: qty 1

## 2015-05-15 MED ORDER — GADOBENATE DIMEGLUMINE 529 MG/ML IV SOLN
10.0000 mL | Freq: Once | INTRAVENOUS | Status: AC | PRN
  Administered 2015-05-15: 10 mL via INTRAVENOUS

## 2015-05-15 MED ORDER — SODIUM CHLORIDE 0.9 % IV SOLN: 250.0000 mL | INTRAVENOUS | Status: DC | PRN

## 2015-05-15 MED ORDER — SODIUM CHLORIDE 0.9 % IJ SOLN
3.0000 mL | Freq: Two times a day (BID) | INTRAMUSCULAR | Status: DC
  Administered 2015-05-15 – 2015-05-18 (×5): 3 mL via INTRAVENOUS

## 2015-05-15 MED ORDER — WHITE PETROLATUM GEL
Status: AC
  Administered 2015-05-15: 03:00:00
  Filled 2015-05-15: qty 1

## 2015-05-15 MED ORDER — SODIUM CHLORIDE 0.9 % IJ SOLN
3.0000 mL | INTRAMUSCULAR | Status: DC | PRN
Start: 2015-05-15 — End: 2015-05-18
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
## 2015-05-15 NOTE — Consult Note (Signed)
Admission H&P    Chief Complaint: Weakness pain of lower extremities with difficulty walking.  HPI: [name] is an 39 y.o. female with a history of sarcoidosis, anemia and scoliosis presenting with new onset weakness involving lower extremities with difficulty walking. She's noticed difficulty with coordinating movements of her lower extremities, particularly left leg. She's also experienced slight numbness involving both feet. Pain in her legs is noticeably worse than baseline intensity. She has experienced urinary urgency and K lesions of incontinence over the past 2-3 days. She's been experiencing lumbar pain as well as mid to upper thoracic pain. She has not noticed any symptoms involving upper extremities. She had an episode of loss of vision for about 5 seconds this morning. Symptoms involving lower extremities were present and she woke up at about noon today. She was last known well at 12:30 AM. 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 Chronic ischemia, cystic neoplasm, vascular malformation or infectious process less favored but could not be ruled out. Repeat study with contrast recommended.  Past Medical History  Diagnosis Date  . Anemia   . 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 Polyp, uterus corpus   . Scoliosis     Past Surgical History  Procedure Laterality Date  . Ovarian cyst removal  1995  . Hernia repair  1996    Left Inguinal  . Cholecystectomy  1997  . Tubal ligation      Family History  Problem Relation Age of Onset  . Hypertension Mother   . Cancer Maternal Uncle     bone cancer, liver cancer  . Cancer Maternal Grandmother     lung cancer  . Hypertension Maternal Grandmother    Social History:  reports that she has been smoking Cigarettes.  She has a 2.5 pack-year smoking history. She has never used smokeless tobacco. She reports that she drinks alcohol. She reports that she  does not use illicit drugs.  Allergies:  Allergies  Allergen Reactions  . Aspirin     REACTION: GI upset  . Hydrocodone     REACTION: Chest pain  . Orange Concentrate [Flavoring Agent] Rash    Medications Prior to Admission  Medication Sig Dispense Refill  . guaiFENesin (ROBITUSSIN) 100 MG/5ML liquid Take 5-10 mLs (100-200 mg total) by mouth every 4 (four) hours as needed for cough. (Patient not taking: Reported on 05/14/2015) 60 mL 0  . megestrol (MEGACE) 40 MG tablet Take 1 tablet (40 mg total) by mouth daily. (Patient not taking: Reported on 05/14/2015) 30 tablet 6  . Menthol (CEPACOL SORE THROAT) 5.4 MG LOZG Use as directed 1 lozenge (5.4 mg total) in the mouth or throat every 6 (six) hours as needed. (Patient not taking: Reported on 05/14/2015) 18 each 0    ROS: History obtained from the patient  General ROS: negative for - chills, fatigue, fever, night sweats, weight gain or weight loss Psychological ROS: negative for - behavioral disorder, hallucinations, memory difficulties, mood swings or suicidal ideation Ophthalmic ROS: negative for - blurry vision, double vision, eye pain or loss of vision ENT ROS: negative for - epistaxis, nasal discharge, oral lesions, sore throat, tinnitus or vertigo Allergy and Immunology ROS: negative for - hives or itchy/watery eyes Hematological and Lymphatic ROS: negative for - bleeding problems, bruising or swollen lymph nodes Endocrine ROS: negative for - galactorrhea, hair pattern changes, polydipsia/polyuria or temperature intolerance Respiratory ROS: negative for - cough, hemoptysis, shortness of  breath or wheezing Cardiovascular ROS: negative for - chest pain, dyspnea on exertion, edema or irregular heartbeat Gastrointestinal ROS: negative for - abdominal pain, diarrhea, hematemesis, nausea/vomiting or stool incontinence Genito-Urinary ROS: negative for - dysuria, hematuria, incontinence or urinary frequency/urgency Musculoskeletal ROS:  negative for - joint swelling or muscular weakness Neurological ROS: as noted in HPI Dermatological ROS: negative for rash and skin lesion changes  Physical Examination: Blood pressure 86/49, pulse 69, temperature 98.2 F (36.8 C), temperature source Oral, resp. rate 20, height $RemoveBe'5\' 3"'KDIIdbiaN$  (1.6 m), weight 62.415 kg (137 lb 9.6 oz), SpO2 100 %.  HEENT-  Normocephalic, no lesions, without obvious abnormality.  Normal external eye and conjunctiva.  Normal TM's bilaterally.  Normal auditory canals and external ears. Normal external nose, mucus membranes and septum.  Normal pharynx. Neck supple with no masses, nodes, nodules or enlargement. Cardiovascular - regular rate and rhythm, S1, S2 normal, no murmur, click, rub or gallop Lungs - chest clear, no wheezing, rales, normal symmetric air entry Abdomen - soft, non-tender; bowel sounds normal; no masses,  no organomegaly Extremities - no joint deformities, effusion, or inflammation  Neurologic Examination: Mental Status: Alert, oriented, thought content appropriate.  Speech fluent without evidence of aphasia. Able to follow commands without difficulty. Cranial Nerves: II-Visual fields were normal. III/IV/VI-Pupils were equal and reacted normally to light. Extraocular movements were full and conjugate.    V/VII-no facial numbness and no facial weakness. VIII-normal. X-normal speech and symmetrical palatal movement. XI: trapezius strength/neck flexion strength normal bilaterally XII-midline tongue extension with normal strength. Motor: Strength of lower extremities was difficult to assess because of pain and guarding with tenderness to pressure over entire lower extremities, left greater than right. She did appear to have greater weakness involving the left lower extremity proximally to the right. She had no clear signs of spasticity. Sensory: Normal throughout. Deep Tendon Reflexes: Slightly hyperactive and lower extremities and  symmetrical.. Plantars: Mute bilaterally Cerebellar: Marked coordination abnormality involving left lower extremity and mild abnormality involving left upper extremity. Coordination of right extremities was normal. Carotid auscultation: Normal  Results for orders placed or performed during the hospital encounter of 05/14/15 (from the past 48 hour(s))  Urinalysis, Routine w reflex microscopic (not at [facility])     Status: Abnormal   Collection Time: 05/14/15  3:24 PM  Result Value Ref Range   Color, Urine YELLOW YELLOW   APPearance CLEAR CLEAR   Specific Gravity, Urine 1.008 1.005 - 1.030   pH 6.5 5.0 - 8.0   Glucose, UA NEGATIVE NEGATIVE mg/dL   Hgb urine dipstick SMALL (A) NEGATIVE   Bilirubin Urine NEGATIVE NEGATIVE   Ketones, ur NEGATIVE NEGATIVE mg/dL   Protein, ur NEGATIVE NEGATIVE mg/dL   Urobilinogen, UA 1.0 0.0 - 1.0 mg/dL   Nitrite NEGATIVE NEGATIVE   Leukocytes, UA NEGATIVE NEGATIVE  Urine microscopic-add on     Status: None   Collection Time: 05/14/15  3:24 PM  Result Value Ref Range   Squamous Epithelial / LPF RARE RARE   WBC, UA 0-2 <3 WBC/hpf   RBC / HPF 0-2 <3 RBC/hpf   Bacteria, UA RARE RARE  CBC with Differential/Platelet     Status: Abnormal   Collection Time: 05/14/15  3:48 PM  Result Value Ref Range   WBC 3.8 (L) 4.0 - 10.5 K/uL   RBC 3.89 3.87 - 5.11 MIL/uL   Hemoglobin 11.8 (L) 12.0 - 15.0 g/dL   HCT 35.5 (L) 36.0 - 46.0 %   MCV 91.3 78.0 - 100.0  fL   MCH 30.3 26.0 - 34.0 pg   MCHC 33.2 30.0 - 36.0 g/dL   RDW 12.7 11.5 - 15.5 %   Platelets 213 150 - 400 K/uL   Neutrophils Relative % 46 %   Neutro Abs 1.8 1.7 - 7.7 K/uL   Lymphocytes Relative 45 %   Lymphs Abs 1.7 0.7 - 4.0 K/uL   Monocytes Relative 8 %   Monocytes Absolute 0.3 0.1 - 1.0 K/uL   Eosinophils Relative 1 %   Eosinophils Absolute 0.0 0.0 - 0.7 K/uL   Basophils Relative 0 %   Basophils Absolute 0.0 0.0 - 0.1 K/uL  Basic metabolic panel     Status: None   Collection Time: 05/14/15  3:48 PM   Result Value Ref Range   Sodium 137 135 - 145 mmol/L   Potassium 4.1 3.5 - 5.1 mmol/L   Chloride 108 101 - 111 mmol/L   CO2 23 22 - 32 mmol/L   Glucose, Bld 84 65 - 99 mg/dL   BUN 11 6 - 20 mg/dL   Creatinine, Ser 0.93 0.44 - 1.00 mg/dL   Calcium 8.9 8.9 - 10.3 mg/dL   GFR calc non Af Amer >60 >60 mL/min   GFR calc Af Amer >60 >60 mL/min    Comment: (NOTE) The eGFR has been calculated using the CKD EPI equation. This calculation has not been validated in all clinical situations. eGFR's persistently <60 mL/min signify possible Chronic Kidney Disease.    Anion gap 6 5 - 15  CK     Status: None   Collection Time: 05/14/15  3:48 PM  Result Value Ref Range   Total CK 129 38 - 234 U/L  I-Stat Beta hCG blood, ED ([facility], [facility], [facility] only)     Status: None   Collection Time: 05/14/15  4:02 PM  Result Value Ref Range   I-stat hCG, quantitative <5.0 <5 mIU/mL   Comment 3            Comment:   GEST. AGE      CONC.  (mIU/mL)   <=1 WEEK        5 - 50     2 WEEKS       50 - 500     3 WEEKS       100 - 10,000     4 WEEKS     1,000 - 30,000        FEMALE AND NON-PREGNANT FEMALE:     LESS THAN 5 mIU/mL    Mr Thoracic Spine Wo Contrast  05/14/2015  CLINICAL DATA:  39 year old female complains of diffuse moderate BILATERAL leg pain and weakness beginning earlier today. Initial encounter. EXAM: MRI THORACIC AND LUMBAR SPINE WITHOUT CONTRAST TECHNIQUE: Multiplanar and multiecho pulse sequences of the thoracic and lumbar spine were obtained without intravenous contrast. COMPARISON:  CT chest 10/22/2007. FINDINGS: MR THORACIC SPINE FINDINGS Numbering of the thoracic spine was performed by counting down from the odontoid. There is a T8 hemivertebra, contributing to a moderate degree of dextroconvex scoliosis centered at that level. The cord appears stretched over T7 and T8 related not only to dextroscoliosis but increased kyphosis. At the T9 level, the cord is abnormal. Axial images demonstrate two adjacent  and fairly symmetric areas of cystic myelomalacia with associated with dorsal cord hyperintensity likely representing chronic gliosis. 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 I am unable to exclude an acute cord injury,  chronic ischemic change, or even another unrelated process such as tumor or infection. Post infusion imaging is recommended. No thoracic disc protrusion or spinal stenosis. No intraspinal mass lesion. Paravertebral soft tissues are unremarkable. MR LUMBAR SPINE FINDINGS No disc protrusion or spinal stenosis. No vertebral body abnormality. Mild lower lumbar facet arthropathy, worst at L4-5, is noncompressive. No spinal stenosis or nerve root impingement. Paravertebral soft tissues unremarkable. IMPRESSION: MR THORACIC SPINE IMPRESSION Chronic T8 hemivertebra, contributing to a moderate degree of dextroconvex scoliosis and increased thoracic kyphosis. 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 More dorsally the cord is hyperintense but does not show cystic change. Chronic ischemia, cystic neoplasm, vascular malformation, or unusual infection is less favored. Post infusion imaging of the thoracic spine, with both coronal T2 and sagittal and axial postcontrast T1 weighted imaging is recommended for further evaluation. It is unclear if the T9 lesion is responsible for the patient's acute leg pain and weakness. MR LUMBAR SPINE IMPRESSION Unremarkable lumbar spine MRI. Electronically Signed   By: Staci Righter M.D.   On: 05/14/2015 20:21   Mr Lumbar Spine Wo Contrast  05/14/2015  CLINICAL DATA:  39 year old female complains of diffuse moderate BILATERAL leg pain and weakness beginning earlier today. Initial encounter. EXAM: MRI THORACIC AND LUMBAR SPINE WITHOUT CONTRAST TECHNIQUE: Multiplanar and multiecho pulse  sequences of the thoracic and lumbar spine were obtained without intravenous contrast. COMPARISON:  CT chest 10/22/2007. FINDINGS: MR THORACIC SPINE FINDINGS Numbering of the thoracic spine was performed by counting down from the odontoid. There is a T8 hemivertebra, contributing to a moderate degree of dextroconvex scoliosis centered at that level. The cord appears stretched over T7 and T8 related not only to dextroscoliosis but increased kyphosis. At the T9 level, the cord is abnormal. Axial images demonstrate two adjacent and fairly symmetric areas of cystic myelomalacia with associated with dorsal cord hyperintensity likely representing chronic gliosis. 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 I am unable to exclude an acute cord injury, chronic ischemic change, or even another unrelated process such as tumor or infection. Post infusion imaging is recommended. No thoracic disc protrusion or spinal stenosis. No intraspinal mass lesion. Paravertebral soft tissues are unremarkable. MR LUMBAR SPINE FINDINGS No disc protrusion or spinal stenosis. No vertebral body abnormality. Mild lower lumbar facet arthropathy, worst at L4-5, is noncompressive. No spinal stenosis or nerve root impingement. Paravertebral soft tissues unremarkable. IMPRESSION: MR THORACIC SPINE IMPRESSION Chronic T8 hemivertebra, contributing to a moderate degree of dextroconvex scoliosis and increased thoracic kyphosis. 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 More dorsally the cord is hyperintense but does not show cystic change. Chronic ischemia, cystic neoplasm, vascular malformation, or unusual infection is less favored. Post infusion imaging of the thoracic spine, with both coronal T2 and sagittal and axial postcontrast T1 weighted imaging is recommended for  further evaluation. It is unclear if the T9 lesion is responsible for the patient's acute leg pain and weakness. MR LUMBAR SPINE IMPRESSION Unremarkable lumbar spine MRI. Electronically Signed   By: Staci Righter M.D.   On: 05/14/2015 20:21    Assessment/Plan 39 year-old lady with acute myelopathy, likely thoracic, lower extremity weakness and pain as well as urinary urgency and incontinence. Significance of mild coordination abnormality involving left upper extremity is unclear. Cerebellar  stroke is unlikely but cannot be completely ruled out.  Recommendations: 1. MRI of thoracic spine with contrast 2. MRI brain without contrast 3. Trial of gabapentin for pain starting at 100 mg 3 times a day 4. Physical therapy consult  We will continue to follow this patient with you.  C.R. Nicole Kindred, MD [facility] [phone]  05/15/2015, 1:59 AM

## 2015-05-16 ENCOUNTER — Observation Stay (HOSPITAL_COMMUNITY): Payer: BLUE CROSS/BLUE SHIELD

## 2015-05-16 DIAGNOSIS — R29898 Other symptoms and signs involving the musculoskeletal system: Secondary | ICD-10-CM | POA: Insufficient documentation

## 2015-05-16 DIAGNOSIS — R278 Other lack of coordination: Secondary | ICD-10-CM | POA: Insufficient documentation

## 2015-05-16 DIAGNOSIS — R531 Weakness: Secondary | ICD-10-CM | POA: Diagnosis not present

## 2015-05-16 DIAGNOSIS — R937 Abnormal findings on diagnostic imaging of other parts of musculoskeletal system: Secondary | ICD-10-CM | POA: Diagnosis not present

## 2015-05-16 LAB — CBC
HCT: 31.5 % — ABNORMAL LOW (ref 36.0–46.0)
Hemoglobin: 10.4 g/dL — ABNORMAL LOW (ref 12.0–15.0)
MCH: 30.1 pg (ref 26.0–34.0)
MCHC: 33 g/dL (ref 30.0–36.0)
MCV: 91.3 fL (ref 78.0–100.0)
PLATELETS: 184 10*3/uL (ref 150–400)
RBC: 3.45 MIL/uL — ABNORMAL LOW (ref 3.87–5.11)
RDW: 12.6 % (ref 11.5–15.5)
WBC: 4.4 10*3/uL (ref 4.0–10.5)

## 2015-05-16 LAB — BASIC METABOLIC PANEL
Anion gap: 7 (ref 5–15)
BUN: 7 mg/dL (ref 6–20)
CALCIUM: 8.4 mg/dL — AB (ref 8.9–10.3)
CHLORIDE: 110 mmol/L (ref 101–111)
CO2: 20 mmol/L — AB (ref 22–32)
CREATININE: 0.9 mg/dL (ref 0.44–1.00)
GFR calc Af Amer: >60 mL/min (ref >60)
GFR calc non Af Amer: >60 mL/min (ref >60)
Glucose, Bld: 93 mg/dL (ref 65–99)
Potassium: 4 mmol/L (ref 3.5–5.1)
SODIUM: 137 mmol/L (ref 135–145)

## 2015-05-16 LAB — RPR: RPR: NONREACTIVE

## 2015-05-16 LAB — HIV-1 RNA QUANT-NO REFLEX-BLD: LOG10 HIV-1 RNA: UNDETERMINED log10copy/mL
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
## 2015-05-16 NOTE — Progress Notes (Addendum)
NEURO HOSPITALIST PROGRESS NOTE   SUBJECTIVE:                                                                                                                        Sitting in a chair getting a quick bath. Mother at the bedside. She said that her left leg is still " lazy and numb" and can not walk without support. Stated the numbness and tingling starts at the bottom of her toes mainly in the left foot and goes up to her upper thigh. Bladder control still poor. 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 Brain MRI without contrast was unrevealing.  OBJECTIVE:                                                                                                                           Vital signs in last 24 hours: Temp:  [98.6 F (37 C)-99.1 F (37.3 C)] 98.8 F (37.1 C) (11/13 0918) Pulse Rate:  [60-71] 60 (11/13 0918) Resp:  [18-20] 18 (11/13 0918) BP: (86-100)/(49-57) 93/56 mmHg (11/13 0918) SpO2:  [98 %-100 %] 98 % (11/13 0918)  Intake/Output from previous day: 11/12 0701 - 11/13 0700 In: 246 [P.O.:240; I.V.:6] Out: -  Intake/Output this shift:   Nutritional status: Diet regular Room service appropriate?: Yes; Fluid consistency:: Thin  Past Medical History  Diagnosis Date  . Anemia   . Sarcoidosis of lung (HCC) 2004  . Polyp, uterus corpus   . Scoliosis    Physical Examination:  HEENT- Normocephalic, no lesions, without obvious abnormality. Normal external eye and conjunctiva. Normal TM's bilaterally. Normal auditory canals and external ears. Normal external nose, mucus membranes and septum. Normal pharynx. Neck supple with no masses, nodes, nodules or enlargement. Cardiovascular - regular rate and rhythm, S1, S2 normal, no murmur, click, rub or gallop Lungs - chest clear,  no wheezing, rales, normal symmetric air entry Abdomen - soft, non-tender; bowel sounds normal; no masses, no organomegaly Extremities - no joint deformities, effusion, or inflammation  Neurologic Exam:  Alert, oriented, thought content appropriate. Speech fluent without evidence of aphasia. Able to follow  commands without difficulty. Cranial Nerves: II-Visual fields were normal. III/IV/VI-Pupils were equal and reacted normally to light. Extraocular movements were full and conjugate.  V/VII-no facial numbness and no facial weakness. VIII-normal. X-normal speech and symmetrical palatal movement. XI: trapezius strength/neck flexion strength normal bilaterally XII-midline tongue extension with normal strength. Motor: Strength of lower extremities was difficult to assess because of pain and guarding with tenderness to pressure over entire lower extremities, left greater than right. She did appear to have greater weakness involving the left lower extremity proximally to the right. She had no clear signs of spasticity. Sensory: Normal throughout. Deep Tendon Reflexes: Slightly hyperactive and lower extremities and symmetrical.. Plantars: Mute bilaterally Cerebellar: Marked coordination abnormality involving left lower extremity and mild abnormality involving left upper extremity. Coordination of right extremities was normal.   Lab Results: No results found for: CHOL Lipid Panel No results for input(s): CHOL, TRIG, HDL, CHOLHDL, VLDL, LDLCALC in the last 72 hours.  Studies/Results: Mr Brain Wo Contrast  05/15/2015  CLINICAL DATA:  Initial evaluation for lower extremity weakness with cord may shin abnormality involving left upper extremity. EXAM: MRI HEAD WITHOUT CONTRAST TECHNIQUE: Multiplanar, multiecho pulse sequences of the brain and surrounding structures were obtained without intravenous contrast. COMPARISON:  Prior head CT from 10/24/2013. FINDINGS: No abnormal foci of restricted  diffusion to suggest acute intracranial infarct. Gray-white matter differentiation maintained. Normal intravascular flow voids preserved. No acute intracranial hemorrhage. A small focus of susceptibility artifact present within the anterior right frontal lobe (series 8, image 19). Additionally, a few small foci of susceptibility artifact more anteriorly and inferiorly within the frontal lobes bilaterally (series 8, image 11). Findings are indeterminate, but may reflect sequela of remote trauma. No significant encephalomalacia with these foci. No other areas of chronic hemorrhage. Cerebral volume within normal limits for patient age. No other parenchymal signal abnormality. No mass lesion or midline shift. No mass effect. Ventricles are normal size without evidence hydrocephalus. No extra-axial fluid collection. Craniocervical junction normal. No tear RA malformation. Pituitary gland normal. No acute abnormality about the orbits. Mild mucosal thickening within the ethmoidal air cells. Small retention cyst within inferior right maxillary sinus. No mastoid effusion. Inner ear structures normal. Bone marrow signal intensity normal. No scalp soft tissue abnormality. IMPRESSION: 1. No acute intracranial infarct or other abnormality identified. 2. Few small foci of susceptibility artifact within the anterior inferior frontal lobes bilaterally, nonspecific, but favored to reflect small chronic micro hemorrhages related to remote trauma. 3. Otherwise normal brain MRI. Electronically Signed   By: Rise Mu M.D.   On: 05/15/2015 06:22   Mr Thoracic Spine Wo Contrast  05/14/2015  CLINICAL DATA:  39 year old female complains of diffuse moderate BILATERAL leg pain and weakness beginning earlier today. Initial encounter. EXAM: MRI THORACIC AND LUMBAR SPINE WITHOUT CONTRAST TECHNIQUE: Multiplanar and multiecho pulse sequences of the thoracic and lumbar spine were obtained without intravenous contrast. COMPARISON:  CT  chest 10/22/2007. FINDINGS: MR THORACIC SPINE FINDINGS Numbering of the thoracic spine was performed by counting down from the odontoid. There is a T8 hemivertebra, contributing to a moderate degree of dextroconvex scoliosis centered at that level. The cord appears stretched over T7 and T8 related not only to dextroscoliosis but increased kyphosis. At the T9 level, the cord is abnormal. Axial images demonstrate two adjacent and fairly symmetric areas of cystic myelomalacia with associated with dorsal cord hyperintensity likely representing chronic gliosis. 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  I am unable to exclude an acute cord injury, chronic ischemic change, or even another unrelated process such as tumor or infection. Post infusion imaging is recommended. No thoracic disc protrusion or spinal stenosis. No intraspinal mass lesion. Paravertebral soft tissues are unremarkable. MR LUMBAR SPINE FINDINGS No disc protrusion or spinal stenosis. No vertebral body abnormality. Mild lower lumbar facet arthropathy, worst at L4-5, is noncompressive. No spinal stenosis or nerve root impingement. Paravertebral soft tissues unremarkable. IMPRESSION: MR THORACIC SPINE IMPRESSION Chronic T8 hemivertebra, contributing to a moderate degree of dextroconvex scoliosis and increased thoracic kyphosis. 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 More dorsally the cord is hyperintense but does not show cystic change. Chronic ischemia, cystic neoplasm, vascular malformation, or unusual infection is less favored. Post infusion imaging of the thoracic spine, with both coronal T2 and sagittal and axial postcontrast T1 weighted imaging is recommended for further evaluation. It is unclear if the T9 lesion is responsible for the patient's acute leg pain and  weakness. MR LUMBAR SPINE IMPRESSION Unremarkable lumbar spine MRI. Electronically Signed   By: Elsie StainJohn T Curnes M.D.   On: 05/14/2015 20:21   Mr Thoracic Spine W Contrast  05/15/2015  CLINICAL DATA:  Initial evaluation for myelopathy with coordination impairment. Lower extremity weakness. EXAM: MRI THORACIC SPINE WITH CONTRAST TECHNIQUE: Multiplanar and multiecho pulse sequences of the thoracic spine were obtained with intravenous contrast. CONTRAST:  10mL MULTIHANCE GADOBENATE DIMEGLUMINE 529 MG/ML IV SOLN COMPARISON:  Prior noncontrast thoracic spine MRI from 05/14/2015. FINDINGS: T8 butterfly vertebra with associated dextro convex scoliosis and kyphosis again noted. Postcontrast imaging demonstrates no abnormal enhancement within the visualized vertebral column or within the thoracic spinal cord. Previously noted cystic abnormality at the level of T9 does not enhance. As a result, this abnormality most likely reflects cystic myelomalacia. Paraspinous soft tissues within normal limits. 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 No other abnormal enhancement within the thoracic spinal cord. Electronically Signed   By: Rise MuBenjamin  McClintock M.D.   On: 05/15/2015 06:58   Mr Lumbar Spine Wo Contrast  05/14/2015  CLINICAL DATA:  39 year old female complains of diffuse moderate BILATERAL leg pain and weakness beginning earlier today. Initial encounter. EXAM: MRI THORACIC AND LUMBAR SPINE WITHOUT CONTRAST TECHNIQUE: Multiplanar and multiecho pulse sequences of the thoracic and lumbar spine were obtained without intravenous contrast. COMPARISON:  CT chest 10/22/2007. FINDINGS: MR THORACIC SPINE FINDINGS Numbering of the thoracic spine was performed by counting down from the odontoid. There is a T8 hemivertebra, contributing to a moderate degree of dextroconvex scoliosis centered at that  level. The cord appears stretched over T7 and T8 related not only to dextroscoliosis but increased kyphosis. At the T9 level, the cord is abnormal. Axial images demonstrate two adjacent and fairly symmetric areas of cystic myelomalacia with associated with dorsal cord hyperintensity likely representing chronic gliosis. 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 I am unable to exclude an acute cord injury, chronic ischemic change, or even another unrelated process such as tumor or infection. Post infusion imaging is recommended. No thoracic disc protrusion or spinal stenosis. No intraspinal mass lesion. Paravertebral soft tissues are unremarkable. MR LUMBAR SPINE FINDINGS No disc protrusion or spinal stenosis. No vertebral body abnormality. Mild lower lumbar facet arthropathy, worst at L4-5, is noncompressive.  No spinal stenosis or nerve root impingement. Paravertebral soft tissues unremarkable. IMPRESSION: MR THORACIC SPINE IMPRESSION Chronic T8 hemivertebra, contributing to a moderate degree of dextroconvex scoliosis and increased thoracic kyphosis. 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 More dorsally the cord is hyperintense but does not show cystic change. Chronic ischemia, cystic neoplasm, vascular malformation, or unusual infection is less favored. Post infusion imaging of the thoracic spine, with both coronal T2 and sagittal and axial postcontrast T1 weighted imaging is recommended for further evaluation. It is unclear if the T9 lesion is responsible for the patient's acute leg pain and weakness. MR LUMBAR SPINE IMPRESSION Unremarkable lumbar spine MRI. Electronically Signed   By: Elsie Stain M.D.   On: 05/14/2015 20:21    MEDICATIONS                                                                                                                         Scheduled: . sodium chloride  3 mL Intravenous Q12H  . sodium chloride  3 mL Intravenous Q12H    ASSESSMENT/PLAN:                                                                                                             39 y/o with acute onset bilateral, left >right LE weakness, urinary urgency and incontinence. Neuro-exam significant for BLE weakness mainly involving the left hip flexors/knee extensors as well slightly brisk DTR's at the patella but without plantar clonus. 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 Adult onset diastematomyelia is a rare occurrence, thus agree with pursuing further imaging as suggested by neurosurgery. PT. Will follow up.   Shelia Portela, MD [facility] [phone]  05/16/2015, 11:16 AM
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
## 2015-05-16 NOTE — Evaluation (Signed)
Physical Therapy Evaluation Patient Details Name: [name] MRN: [id number] DOB: [birth year] Today's Date: 05/16/2015   History of Present Illness  [name] is a 39 y.o. woman with a history of sarcoidosis, scoliosis, and chronic anemia who presents to the ED complaining of multiple new onset neurological symptoms. 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 Pt with episodes of impaired motor planning during ambulation however not constant. Pt also reports being "worked to hard" at her job and that her son's father is also in the hospital. At this time pt unsafe to return home alone and pt reports her sister and mother can assist.     Follow Up Recommendations Home health PT;Supervision/Assistance - 24 hour    Equipment Recommendations  Rolling walker with 5" wheels    Recommendations for Other Services       Precautions / Restrictions Precautions Precautions: Fall Restrictions Weight Bearing Restrictions: No      Mobility  Bed Mobility Overal bed mobility: Modified Independent             General bed mobility comments: slow but able to move LEs off EOB and come to sitting, HOB Elevated  Transfers Overall transfer level: Needs assistance Equipment used: Rolling walker (2 wheeled) Transfers: Sit to/from Stand Sit to Stand: Min guard         General transfer comment: slow, excessive trunk flexion  Ambulation/Gait Ambulation/Gait assistance: Min assist Ambulation Distance (Feet): 50 Feet Assistive device: Rolling walker (2 wheeled) Gait Pattern/deviations: Step-through pattern;Decreased stride length Gait velocity: slow Gait velocity interpretation: Below normal speed for age/gender General Gait Details:  pt with increased UE WBing. Pt had 3 episodes of shaking/tremor like activity. Pt also had 3 episodes where she report "see my leg just be stupid, it won't go when I tell it too." pt with no knee buckling but fatigued easily  Stairs            Wheelchair Mobility    Modified Rankin (Stroke Patients Only)       Balance Overall balance assessment: Needs assistance         Standing balance support: Bilateral upper extremity supported Standing balance-Leahy Scale: Fair Standing balance comment: okay with static but needs assist with amb                             Pertinent Vitals/Pain Pain Assessment: 0-10 Pain Score: 3  Pain Location: L hip/thigh Pain Descriptors / Indicators: Aching;Pins and needles;Tingling    Home Living Family/patient expects to be discharged to:: Private residence Living Arrangements: Spouse/significant other;Children Available Help at Discharge: Family;Available 24 hours/day Type of Home: House Home Access: Stairs to enter Entrance Stairs-Rails: None Entrance Stairs-Number of Steps: 2 Home Layout: Multi-level;Able to live on main level with bedroom/bathroom Home Equipment: None      Prior Function Level of Independence: Independent         Comments: works as a Engineer, petroleum   Dominant Hand: Right    Extremity/Trunk Assessment   Upper Extremity Assessment: Overall WFL for tasks assessed           Lower Extremity Assessment: LLE deficits/detail   LLE Deficits / Details: grossly 3-/5  Cervical / Trunk  Assessment:  (scoliosis)  Communication   Communication: No difficulties  Cognition Arousal/Alertness: Awake/alert Behavior During Therapy: WFL for tasks assessed/performed Overall Cognitive Status: Within Functional Limits for tasks assessed                      General Comments      Exercises        Assessment/Plan    PT Assessment Patient needs continued PT services  PT  Diagnosis Difficulty walking;Acute pain;Generalized weakness   PT Problem List Decreased strength;Decreased range of motion;Decreased activity tolerance;Decreased balance;Decreased mobility  PT Treatment Interventions DME instruction;Gait training;Stair training;Functional mobility training;Therapeutic activities;Therapeutic exercise;Balance training   PT Goals (Current goals can be found in the Care Plan section) Acute Rehab PT Goals Patient Stated Goal: get my leg fixed PT Goal Formulation: With patient Time For Goal Achievement: 05/23/15 Potential to Achieve Goals: Good    Frequency Min 4X/week   Barriers to discharge        Co-evaluation               End of Session Equipment Utilized During Treatment: Gait belt Activity Tolerance: Patient tolerated treatment well Patient left: in bed;with call bell/phone within reach Nurse Communication: Mobility status    Functional Assessment Tool Used: clinical judgement Functional Limitation: Mobility: Walking and moving around Mobility: Walking and Moving Around Current Status ([id number]): At least 20 percent but less than 40 percent impaired, limited or restricted Mobility: Walking and Moving Around Goal Status [phone]([id number]): At least 1 percent but less than 20 percent impaired, limited or restricted    Time: 1355-1419 PT Time Calculation (min) (ACUTE ONLY): 24 min   Charges:   PT Evaluation $Initial PT Evaluation Tier I: 1 Procedure PT Treatments $Gait Training: 8-22 mins   PT G Codes:   PT G-Codes **NOT FOR INPATIENT CLASS** Functional Assessment Tool Used: clinical judgement Functional Limitation: Mobility: Walking and moving around Mobility: Walking and Moving Around Current Status ([id number]([id number]): At least 20 percent but less than 40 percent impaired, limited or restricted Mobility: Walking and Moving Around Goal Status [phone]([id number]): At least 1 percent but less than 20 percent impaired, limited or restricted    Marcene BrawnChadwell, Aloria Looper  Marie 05/16/2015, 3:26 PM   Lewis ShockAshly Verlon Pischke, PT, DPT Pager #: [phone] Office #: [phone]

## 2015-05-17 ENCOUNTER — Observation Stay (HOSPITAL_COMMUNITY): Payer: BLUE CROSS/BLUE SHIELD

## 2015-05-17 DIAGNOSIS — R29898 Other symptoms and signs involving the musculoskeletal system: Secondary | ICD-10-CM | POA: Diagnosis not present

## 2015-05-17 DIAGNOSIS — R531 Weakness: Secondary | ICD-10-CM | POA: Diagnosis not present

## 2015-05-17 DIAGNOSIS — R278 Other lack of coordination: Secondary | ICD-10-CM | POA: Diagnosis not present

## 2015-05-17 DIAGNOSIS — R937 Abnormal findings on diagnostic imaging of other parts of musculoskeletal system: Secondary | ICD-10-CM | POA: Diagnosis not present

## 2015-05-17 DIAGNOSIS — R269 Unspecified abnormalities of gait and mobility: Secondary | ICD-10-CM | POA: Diagnosis not present

## 2015-05-17 MED ORDER — GADOBENATE DIMEGLUMINE 529 MG/ML IV SOLN
12.0000 mL | Freq: Once | INTRAVENOUS | Status: AC | PRN
  Administered 2015-05-17: 12 mL via INTRAVENOUS

## 2015-05-17 MED ORDER — BACLOFEN 10 MG PO TABS
10.0000 mg | ORAL_TABLET | Freq: Every day | ORAL | Status: DC
  Administered 2015-05-17: 10 mg via ORAL
  Filled 2015-05-17: qty 1

## 2015-05-17 MED ORDER — BACLOFEN 10 MG PO TABS
5.0000 mg | ORAL_TABLET | Freq: Every day | ORAL | Status: DC
  Administered 2015-05-17 – 2015-05-18 (×2): 5 mg via ORAL
  Filled 2015-05-17 (×2): qty 1

## 2015-05-17 MED ORDER — LORAZEPAM 2 MG/ML IJ SOLN
0.5000 mg | Freq: Once | INTRAMUSCULAR | Status: AC
  Administered 2015-05-17: 0.5 mg via INTRAVENOUS
  Filled 2015-05-17: qty 1

## 2015-05-17 MED ORDER — FAMOTIDINE 20 MG PO TABS
20.0000 mg | ORAL_TABLET | Freq: Two times a day (BID) | ORAL | Status: DC
  Administered 2015-05-17 – 2015-05-18 (×3): 20 mg via ORAL
  Filled 2015-05-17 (×3): qty 1
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
## 2015-05-17 NOTE — Progress Notes (Signed)
Physical Therapy Treatment Patient Details Name: [name] MRN: [id number] DOB: 1976-01-07 Today's Date: 05/17/2015    History of Present Illness [name] is a 39 y.o. woman with a history of sarcoidosis, scoliosis, and chronic anemia who presents to the ED complaining of multiple new onset neurological symptoms. She had a repeat MRI done with contrast showing a diastematomyelia and abnormal cord signal at the level of the T9    PT Comments    Patient reports her leg feels stronger (strength testing quads 3-/5). Gait was improved with good placement of LLE. She was however limited by sudden onset of "feeling faint" and nauseous after walking 90 ft. Required a chair brought to her and rolled back to her room. Noted plans for cervical MRI.   Follow Up Recommendations  Home health PT;Supervision/Assistance - 24 hour     Equipment Recommendations  Rolling walker with 5" wheels    Recommendations for Other Services       Precautions / Restrictions Precautions Precautions: Fall Precaution Comments: reports fell getting out of shower 11/11 PTA Restrictions Weight Bearing Restrictions: No    Mobility  Bed Mobility Overal bed mobility: Modified Independent                Transfers Overall transfer level: Needs assistance Equipment used: Rolling walker (2 wheeled) Transfers: Sit to/from Stand Sit to Stand: Min guard         General transfer comment: x 3 various surfaces; slow, excessive trunk flexion; vc for safe use of RW  Ambulation/Gait Ambulation/Gait assistance: Min guard Ambulation Distance (Feet): 90 Feet Assistive device: Rolling walker (2 wheeled) Gait Pattern/deviations: Step-through pattern;Decreased stride length Gait velocity: slow Gait velocity interpretation: Below normal speed for age/gender General Gait Details: pt with increased UE WBing. No buckling of Lt knee, however she tends to keep Knee "locked in extension;" after walking  90 ft, she stated she felt faint and immediately needed to sit down (chair brought to her) she then also reported new onset nausea; pt rolled back to her room in chair and assist back to bed   Stairs Stairs:  (unable due to feeling faint and nausea)          Wheelchair Mobility    Modified Rankin (Stroke Patients Only)       Balance           Standing balance support: Bilateral upper extremity supported Standing balance-Leahy Scale: Poor Standing balance comment: requires bil UE support on RW                    Cognition Arousal/Alertness: Awake/alert Behavior During Therapy: Flat affect Overall Cognitive Status: Within Functional Limits for tasks assessed                      Exercises      General Comments        Pertinent Vitals/Pain Pain Assessment: 0-10 Pain Score: 5  Pain Location: LLE Pain Descriptors / Indicators: Pins and needles Pain Intervention(s): Limited activity within patient's tolerance;Monitored during session;Repositioned    Home Living                      Prior Function            PT Goals (current goals can now be found in the care plan section) Acute Rehab PT Goals Patient Stated Goal: get my leg fixed Time For Goal Achievement: 05/23/15 Progress towards PT goals: Progressing  toward goals    Frequency  Min 4X/week    PT Plan Current plan remains appropriate    Co-evaluation             End of Session Equipment Utilized During Treatment: Gait belt Activity Tolerance: Treatment limited secondary to medical complications (Comment) (faint feeling and nausea) Patient left: in bed;with call bell/phone within reach;with bed alarm set     Time: 1322-1400 PT Time Calculation (min) (ACUTE ONLY): 38 min  Charges:  $Gait Training: 38-52 mins                    G Codes:      [name] 06-11-2015, 2:14 PM Pager [phone]

## 2015-05-17 NOTE — Progress Notes (Signed)
Subjective: patient continues to have pain in both legs when moving and feels her left is weaker than her right. She is having no bladder incontinence at this point.   Exam: Filed Vitals:   05/17/15 0515  BP: 91/59  Pulse: 71  Temp: 99.1 F (37.3 C)  Resp: 18    O/E  Gen: In bed, NAD MS: alert and oriented, speech clear. Follows al commands.  CN: PERRLA, EOMI, TML, Face symmetric.  Motor: UE 5/5, left LE 5/5 and right LE 5/5.  Sensory: states she has decreased sensation on the left from just below rib cage down to her left foot. notmal sensation on the right.  DTR: slightly brisk > 2+ throughout in all 4 limbs , symmetric, with no clonus in ankles. Normal plantars.   Pertinent Labs: none  Felicie MornDavid Smith PA-C [facility] [phone]  Impression: 39 y/o with acute onset subjective bilateral, left >right LE weakness, urinary urgency and incontinence. Etiology of symptoms not clear. Symptoms improving today. 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 I doubt that this lesion is symptomatic at this time. MRI appearance may suggest a chronic lesion, no enhancement noted. No motor weakness on my exam today.  Encouraged to work with PT for gait training.   Recommendations: Will order  MRI cervical spine to r/o cervical cord pathology.   Will f/u.     05/17/2015, 9:03 AM

## 2015-05-17 NOTE — Care Management Note (Signed)
Case Management Note  Patient Details  Name: [name] MRN: [id number] Date of Birth: Dec 09, 1975  Subjective/Objective:                    Action/Plan: Patient admitted with Lt sided weakness and incontinence with an abnormality on her MRI. Patient lives at home with her husband. Patient does not have a PCP or insurance listed. Pt continues with testing. CM will continue to follow for discharge needs.  Expected Discharge Date:                  Expected Discharge Plan:     In-House Referral:     Discharge planning Services     Post Acute Care Choice:    Choice offered to:     DME Arranged:    DME Agency:     HH Arranged:    HH Agency:     Status of Service:  In process, will continue to follow  Medicare Important Message Given:    Date Medicare IM Given:    Medicare IM give by:    Date Additional Medicare IM Given:    Additional Medicare Important Message give by:     If discussed at Long Length of Stay Meetings, dates discussed:    Additional Comments:  Kermit BaloKelli F Cybill Uriegas, RN 05/17/2015, 1:51 PM

## 2015-05-18 DIAGNOSIS — R937 Abnormal findings on diagnostic imaging of other parts of musculoskeletal system: Secondary | ICD-10-CM | POA: Diagnosis not present

## 2015-05-18 DIAGNOSIS — R278 Other lack of coordination: Secondary | ICD-10-CM | POA: Diagnosis not present

## 2015-05-18 DIAGNOSIS — R531 Weakness: Secondary | ICD-10-CM | POA: Diagnosis not present

## 2015-05-18 DIAGNOSIS — R269 Unspecified abnormalities of gait and mobility: Secondary | ICD-10-CM

## 2015-05-18 MED ORDER — BACLOFEN 10 MG PO TABS: 10.0000 mg | ORAL_TABLET | Freq: Every day | ORAL | Status: DC

## 2015-05-18 MED ORDER — BACLOFEN 10 MG PO TABS: 5.0000 mg | ORAL_TABLET | Freq: Every day | ORAL | Status: DC

## 2015-05-18 MED ORDER — BACLOFEN 10 MG PO TABS
10.0000 mg | ORAL_TABLET | Freq: Every day | ORAL | Status: DC
Start: 2015-05-18 — End: 2017-08-30

## 2015-05-18 NOTE — Progress Notes (Signed)
Physical Therapy Treatment Patient Details Name: [name] MRN: [id number] DOB: Aug 31, 1975 Today's Date: 05/18/2015    History of Present Illness [name] is a 39 y.o. woman with a history of sarcoidosis, scoliosis, and chronic anemia who presents to the ED complaining of multiple new onset neurological symptoms. She had a repeat MRI done with contrast showing a diastematomyelia and abnormal cord signal at the level of the T9. Cervical MRI negative    PT Comments    Patient with brighter affect. Reports incr sensation bottom of Lt foot (however did experience increasing numbness the more she walked). + clonus LLE that persists as long as she has pressure on forefoot; ceases when pressure removed.    Follow Up Recommendations  Home health PT;Supervision/Assistance - 24 hour     Equipment Recommendations  Rolling walker with 5" wheels    Recommendations for Other Services       Precautions / Restrictions Precautions Precautions: Fall Precaution Comments: reports fell getting out of shower 11/11 PTA Restrictions Weight Bearing Restrictions: No    Mobility  Bed Mobility Overal bed mobility: Modified Independent                Transfers Overall transfer level: Needs assistance Equipment used: Rolling walker (2 wheeled) Transfers: Sit to/from Stand Sit to Stand: Min guard         General transfer comment: x 3 various surfaces; slow, cautious; vc for safe use of RW  Ambulation/Gait Ambulation/Gait assistance: Min guard Ambulation Distance (Feet): 80 Feet (seated rest, 80) Assistive device: Rolling walker (2 wheeled) Gait Pattern/deviations: Step-through pattern;Decreased stride length;Decreased weight shift to left (Lt hip hike and trunk rotation to advance LLE) Gait velocity: slow Gait velocity interpretation: Below normal speed for age/gender General Gait Details: smoother gait; able to keep Lt knee in slight flexion (not locked in extension)  in weight-bearing; using hip hike and trunk rotation to advance LLE   Stairs Stairs: Yes Stairs assistance: Min assist Stair Management: Two rails;Step to pattern;Forwards Number of Stairs: 2 General stair comments: vc for safe sequencing; no buckling LLE, however did elicit clonus LLE as ascending  Wheelchair Mobility    Modified Rankin (Stroke Patients Only)       Balance           Standing balance support: Bilateral upper extremity supported Standing balance-Leahy Scale: Poor                      Cognition Arousal/Alertness: Awake/alert Behavior During Therapy: WFL for tasks assessed/performed Overall Cognitive Status: Within Functional Limits for tasks assessed                      Exercises General Exercises - Lower Extremity Long Arc Quad: AROM;AAROM;Left;5 reps Hip Flexion/Marching: AAROM;Both;5 reps;Standing (with RW) Mini-Sqauts: AAROM;Both;5 reps;Standing Other Exercises Other Exercises: standing Lt hamstring curls x 10    General Comments General comments (skin integrity, edema, etc.): Palpable edema superior to Lt patella. Several episodes of LLE clonus that resolves with removing pressure through forefoot.      Pertinent Vitals/Pain Pain Assessment: 0-10 Pain Score: 5  Pain Location: LLE Pain Descriptors / Indicators: Tingling;Throbbing;Spasm Pain Intervention(s): Limited activity within patient's tolerance;Monitored during session;Premedicated before session;Repositioned    Home Living                      Prior Function            PT Goals (current  goals can now be found in the care plan section) Acute Rehab PT Goals Patient Stated Goal: get my leg fixed Time For Goal Achievement: 05/23/15 Progress towards PT goals: Progressing toward goals    Frequency  Min 4X/week    PT Plan Current plan remains appropriate    Co-evaluation             End of Session Equipment Utilized During Treatment: Gait  belt Activity Tolerance: Patient tolerated treatment well Patient left: with call bell/phone within reach;in chair     Time: [id number] PT Time Calculation (min) (ACUTE ONLY): 39 min  Charges:  $Gait Training: 23-37 mins $Therapeutic Exercise: 8-22 mins                    G Codes:      Cleota Pellerito 05/18/2015, 12:05 PM Pager [phone]

## 2015-05-18 NOTE — Progress Notes (Signed)
Subjective: Patient is admitted with lower extremity weakness. She does not have any definite motor weakness on my examination today. She reported that-her pain in the lower extremities is improved after starting baclofen yesterday. She was able to work with physical therapy and ambulated using a walker. Patient is a Engineer, civil (consulting)nurse. She does not have any other new neurological symptoms no new vision or speech problems or motor weakness in upper or lower extremity is no new bowel or bladder problems.  MRI of the cervical spine did not show any significant spinal canal stenosis or spinal cord pathology in the C-spine. 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  Objective: Current vital signs: BP 82/46 mmHg  Pulse 65  Temp(Src) 98.6 F (37 C) (Oral)  Resp 20  Ht 5\' 3"  (1.6 m)  Wt 62.415 kg (137 lb 9.6 oz)  BMI 24.38 kg/m2  SpO2 100%  LMP 04/16/2015 Vital signs in last 24 hours: Temp:  [98 F (36.7 C)-98.6 F (37 C)] 98.6 F (37 C) (11/15 1318) Pulse Rate:  [55-69] 65 (11/15 1318) Resp:  [20] 20 (11/15 1318) BP: (82-101)/(45-68) 82/46 mmHg (11/15 1318) SpO2:  [99 %-100 %] 100 % (11/15 1318)  Neurologic Exam: Nonfocal neurological examination noted. She is alert, oriented 4 speech is fluent no dysarthria or aphasia. Cranial nerve examination 2 through 12 is grossly intact motor examination showed normal tone and full strength were normal for extremities. Reflexes slightly brisk greater than 2+ no friction is symmetric, normal plantars. Normal sensory examination. no abnormal involuntary movements.  Lab Results: Basic Metabolic Panel:  Recent Labs Lab 05/14/15 1548 05/15/15 0231 05/16/15 0453  NA 137 138 137  K 4.1 4.0 4.0  CL 108 109 110  CO2 23 23 20*  GLUCOSE 84 124* 93  BUN 11 14 7   CREATININE 0.93 0.97 0.90  CALCIUM 8.9 8.7* 8.4*    Liver Function  Tests:  Recent Labs Lab 05/15/15 0231  AST 19  ALT 12*  ALKPHOS 55  BILITOT 0.6  PROT 6.2*  ALBUMIN 3.6   No results for input(s): LIPASE, AMYLASE in the last 168 hours. No results for input(s): AMMONIA in the last 168 hours.  CBC:  Recent Labs Lab 05/14/15 1548 05/15/15 0231 05/16/15 0453  WBC 3.8* 4.0 4.4  NEUTROABS 1.8  --   --   HGB 11.8* 10.6* 10.4*  HCT 35.5* 32.1* 31.5*  MCV 91.3 90.9 91.3  PLT 213 212 184    Cardiac Enzymes:  Recent Labs Lab 05/14/15 1548  CKTOTAL 129    Lipid Panel: No results for input(s): CHOL, TRIG, HDL, CHOLHDL, VLDL, LDLCALC in the last 168 hours.  CBG: No results for input(s): GLUCAP in the last 168 hours.  Microbiology: Results for orders placed or performed during the hospital encounter of 08/05/14  Rapid strep screen     Status: None   Collection Time: 08/05/14  3:09 PM  Result Value Ref Range Status   Streptococcus, Group A Screen (Direct) NEGATIVE NEGATIVE Final    Comment: (NOTE) A Rapid Antigen test may result negative if the antigen level in the sample is below the detection level of this test. The FDA has not cleared this test as a stand-alone test therefore the rapid antigen negative result has reflexed to a Group A Strep culture.   Culture, Group A Strep     Status: None  Collection Time: 08/05/14  3:09 PM  Result Value Ref Range Status   Specimen Description THROAT  Final   Special Requests NONE  Final   Culture   Final    No Beta Hemolytic Streptococci Isolated Performed at [facility]    Report Status 08/07/2014 FINAL  Final    Coagulation Studies: No results for input(s): LABPROT, INR in the last 72 hours.  Imaging: Mr Cervical Spine W Wo Contrast  05/17/2015  CLINICAL DATA:  Initial evaluation for left leg weakness. 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 Further evaluation. EXAM: MRI CERVICAL SPINE WITHOUT AND WITH CONTRAST TECHNIQUE: Multiplanar and multiecho pulse sequences of the  cervical spine, to include the craniocervical junction and cervicothoracic junction, were obtained according to standard protocol without and with intravenous contrast. CONTRAST:  12mL MULTIHANCE GADOBENATE DIMEGLUMINE 529 MG/ML IV SOLN COMPARISON:  None available. FINDINGS: Visualized portions of the brain and posterior fossa demonstrate a normal appearance with normal signal intensity. Craniocervical junction widely patent. No Chiari malformation. Vertebral bodies are normally aligned with preservation of the normal cervical lordosis. Vertebral body heights are well maintained. No listhesis or malalignment. Signal intensity within the vertebral body bone marrow is normal. No focal osseous lesion. No marrow edema. Signal intensity within the cervical spinal cord is normal. No abnormal enhancement within the cervical spinal cord. Paraspinous soft tissues are unremarkable. Normal intravascular flow voids within the vertebral arteries bilaterally. C2-C3: Negative. C3-C4:  Negative. C4-C5: Very mild bilateral uncovertebral hypertrophy. No significant canal or foraminal stenosis. C5-C6: Mild annular disc bulge with disc desiccation. No significant stenosis. C6-C7: Minimal annular disc bulge with disc desiccation. No significant stenosis. C7-T1:  Negative. Visualized upper thoracic spine is unremarkable without stenosis or significant degenerative changes. IMPRESSION: 1. Minimal degenerative disc bulging at C5-6 and C6-7 without significant stenosis. 2. Otherwise normal MRI of the cervical spine. Electronically Signed   By: Rise Mu M.D.   On: 05/17/2015 22:03    Medications: I have reviewed the patient's current medications.  Assessment/Plan: Pt with subjective leg weakness and pain, difficulty with walking.  Better today. Able to walk with walker. No motor weakness on exam. Cannot cannot exclude possible conversion disorder as her gait difficulty appears to be out of proportion to any noticeable  weakness in her lower extremities.  Can discharge home today.  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   Discussed her assessment and plan with the primary hospitalist physician.      05/18/2015, 3:15 PM

## 2015-05-18 NOTE — Progress Notes (Signed)
Pt for discharge home today. Discharge orders received. IV with dressing clean dry and intact to lower back. Discharge instructions and prescriptions (1)given with verbalized understanding. Staff brought patient to lobby via wheelchair at this time. Transported to home by family member.
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
## 2015-05-18 NOTE — Care Management Note (Signed)
Case Management Note  Patient Details  Name: [name] MRN: [id number] Date of Birth: 1976-03-28  Subjective/Objective:                    Action/Plan: Patient to be discharged later today. PT recommending HHPT but patient without insurance and without a qualifying diagnosis for charity Home health services. Pt also recommended to have a rolling walker and patient is interested in having this for home. Jermaine with [facility] notified and is going to deliver a walker to the patients room. CM met with patient and she states she has a PCP but has not been in several years. CM asked if she plans on returning to her PCP or would she like to be set up with the [facility]. Patient wanted to be set up with [facility]. CM called the [facility] who is taking overflow for [facility] and obtained the patient an appointment. Appointment placed on the AVS. CM looked over the meds Pt receiving in the hospital and the pepcid is on the $4 list at [facility] and the baclofen is $10 at [facility]. The patient states she can afford these two meds. Patient also informed she can use the pharmacy at the [facility] as long as she is being seen in their clinic for reduced cost of her meds.    Expected Discharge Date:                  Expected Discharge Plan:  Home/Self Care  In-House Referral:     Discharge planning Services  CM Consult  Post Acute Care Choice:    Choice offered to:     DME Arranged:    DME Agency:     HH Arranged:    HH Agency:     Status of Service:  In process, will continue to follow  Medicare Important Message Given:    Date Medicare IM Given:    Medicare IM give by:    Date Additional Medicare IM Given:    Additional Medicare Important Message give by:     If discussed at [place] of Stay Meetings, dates discussed:    Additional Comments:  Pollie Friar, RN 05/18/2015, 2:16 PM

## 2015-05-19 DIAGNOSIS — R269 Unspecified abnormalities of gait and mobility: Secondary | ICD-10-CM | POA: Insufficient documentation

## 2015-06-18 ENCOUNTER — Emergency Department (INDEPENDENT_AMBULATORY_CARE_PROVIDER_SITE_OTHER): Payer: BLUE CROSS/BLUE SHIELD

## 2015-06-18 ENCOUNTER — Encounter (HOSPITAL_COMMUNITY): Payer: Self-pay | Admitting: *Deleted

## 2015-06-18 ENCOUNTER — Emergency Department (HOSPITAL_COMMUNITY)
Admission: EM | Admit: 2015-06-18 | Discharge: 2015-06-18 | Discharge status: Absent without leave | Payer: BLUE CROSS/BLUE SHIELD

## 2015-06-18 ENCOUNTER — Emergency Department (INDEPENDENT_AMBULATORY_CARE_PROVIDER_SITE_OTHER)
Admission: EM | Admit: 2015-06-18 | Discharge: 2015-06-18 | Disposition: A | Discharge status: Other Acute Inpatient Hospital | Payer: BLUE CROSS/BLUE SHIELD | Source: Home / Self Care | Attending: Family Medicine | Admitting: Family Medicine

## 2015-06-18 DIAGNOSIS — R079 Chest pain, unspecified: Secondary | ICD-10-CM

## 2015-06-18 DIAGNOSIS — R0789 Other chest pain: Secondary | ICD-10-CM | POA: Diagnosis not present

## 2015-06-18 NOTE — ED Provider Notes (Signed)
CSN: [id number]     Arrival date & time 06/18/15  1703 History   First MD Initiated Contact with Patient 06/18/15 1722     Chief Complaint  Patient presents with  . Chest Pain   (Consider location/radiation/quality/duration/timing/severity/associated sxs/prior Treatment) Patient is a 39 y.o. female presenting with chest pain. The history is provided by the patient.  Chest Pain Pain location:  R lateral chest Pain quality: sharp   Pain radiates to:  Does not radiate Pain radiates to the back: no   Pain severity:  Moderate Onset quality:  Sudden Duration:  1 day Progression:  Unchanged Chronicity:  New Context: no movement and not raising an arm   Relieved by:  Nothing Worsened by:  Nothing tried Ineffective treatments:  None tried Associated symptoms: cough   Associated symptoms: no back pain, no fever, no lower extremity edema, no nausea, no palpitations and no shortness of breath   Associated symptoms comment:  Pleuritic pain . Risk factors: smoking   Risk factors: no birth control     Past Medical History  Diagnosis Date  . Anemia   . Sarcoidosis of lung (HCC) 2004  . Polyp, uterus corpus   . Scoliosis    Past Surgical History  Procedure Laterality Date  . Ovarian cyst removal  1995  . Hernia repair  1996    Left Inguinal  . Cholecystectomy  1997  . Tubal ligation     Family History  Problem Relation Age of Onset  . Hypertension Mother   . Cancer Maternal Uncle     bone cancer, liver cancer  . Cancer Maternal Grandmother     lung cancer  . Hypertension Maternal Grandmother    Social History  Substance Use Topics  . Smoking status: Current Every Day Smoker -- 0.50 packs/day for 5 years    Types: Cigarettes  . Smokeless tobacco: Never Used  . Alcohol Use: Yes     Comment: occasional   OB History    Gravida Para Term Preterm AB TAB SAB Ectopic Multiple Living   5 4 4  1  1  0 0 3     Review of Systems  Constitutional: Negative.  Negative for fever.   HENT: Negative.   Respiratory: Positive for cough and chest tightness. Negative for choking and shortness of breath.   Cardiovascular: Positive for chest pain. Negative for palpitations and leg swelling.  Gastrointestinal: Negative for nausea.  Musculoskeletal: Negative for back pain.  All other systems reviewed and are negative.   Allergies  Aspirin; Hydrocodone; and Orange concentrate  Home Medications   Prior to Admission medications   Medication Sig Start Date End Date Taking? Authorizing Provider  baclofen (LIORESAL) 10 MG tablet Take 1 tablet (10 mg total) by mouth at bedtime. 05/18/15   Jeralyn BennettEzequiel Zamora, MD  baclofen (LIORESAL) 10 MG tablet Take 0.5 tablets (5 mg total) by mouth daily. 05/18/15   Jeralyn BennettEzequiel Zamora, MD   Meds Ordered and Administered this Visit  Medications - No data to display  BP 109/72 mmHg  Pulse 78  Temp(Src) 98.6 F (37 C) (Oral)  Resp 18  SpO2 99%  LMP 05/14/2015 No data found.   Physical Exam  Constitutional: She is oriented to person, place, and time. She appears well-developed and well-nourished. She appears distressed.  Neck: Normal range of motion. Neck supple.  Cardiovascular: Normal rate, regular rhythm and normal heart sounds.   Pulmonary/Chest: Effort normal. She has decreased breath sounds in the right middle field and the right  lower field. She has no wheezes. She has no rhonchi. She has no rales. She exhibits no tenderness.  Lymphadenopathy:    She has no cervical adenopathy.  Neurological: She is alert and oriented to person, place, and time.  Skin: Skin is warm and dry.  Nursing note and vitals reviewed.   ED Course  Procedures (including critical care time)  Labs Review Labs Reviewed - No data to display  Imaging Review Dg Chest 2 View  06/18/2015  CLINICAL DATA:  Right-sided chest pain for 1 day EXAM: CHEST - 2 VIEW COMPARISON:  12/29/2012, 05/16/2015 FINDINGS: Cardiac shadow is stable. Scoliosis of the thoracic spine is  again noted. 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 The lungs are well aerated bilaterally. No focal acute infiltrate or sizable effusion is seen. IMPRESSION: No acute abnormality noted. Electronically Signed   By: Alcide Clever M.D.   On: 06/18/2015 18:34   X-rays reviewed and report per radiologist.   Visual Acuity Review  Right Eye Distance:   Left Eye Distance:   Bilateral Distance:    Right Eye Near:   Left Eye Near:    Bilateral Near:         MDM   1. Chest pain of uncertain etiology    Sent for eval of right sided cp-consider r/o pulm embol.   Linna Hoff, MD 06/18/15 [phone]

## 2015-06-18 NOTE — ED Notes (Signed)
Pt called multiple times for triage with no answer.

## 2015-06-18 NOTE — ED Notes (Signed)
Pt  Reports  Pain  r   Upper  Chest  Pain        Since  Yesterday           Symptoms      Started   Yesterday     Pt  Reports   Cough        As    Well             Pt  Rates  Pain as  3           Pt reports  It  Hurts  When  She  Laughs  Coughs  Or  Takes   A  Deep breath          She  Is  Sitting  Upright on exam table  Speaking in  Complete  sentances         denys  Any  injury

## 2015-06-30 ENCOUNTER — Ambulatory Visit: Payer: BLUE CROSS/BLUE SHIELD | Admitting: Neurology

## 2015-10-17 ENCOUNTER — Encounter (HOSPITAL_COMMUNITY): Payer: Self-pay

## 2015-10-17 ENCOUNTER — Emergency Department (HOSPITAL_COMMUNITY)
Admission: EM | Admit: 2015-10-17 | Discharge: 2015-10-17 | Disposition: A | Discharge status: Home / Self Care | Payer: BLUE CROSS/BLUE SHIELD | Source: Home / Self Care

## 2015-10-17 ENCOUNTER — Emergency Department (HOSPITAL_COMMUNITY)
Admission: EM | Admit: 2015-10-17 | Discharge: 2015-10-17 | Disposition: A | Discharge status: Home / Self Care | Payer: BLUE CROSS/BLUE SHIELD | Attending: Emergency Medicine | Admitting: Emergency Medicine

## 2015-10-17 ENCOUNTER — Encounter (HOSPITAL_COMMUNITY): Payer: Self-pay | Admitting: Emergency Medicine

## 2015-10-17 DIAGNOSIS — M25552 Pain in left hip: Secondary | ICD-10-CM

## 2015-10-17 DIAGNOSIS — Z79899 Other long term (current) drug therapy: Secondary | ICD-10-CM | POA: Insufficient documentation

## 2015-10-17 DIAGNOSIS — R51 Headache: Secondary | ICD-10-CM | POA: Insufficient documentation

## 2015-10-17 DIAGNOSIS — M545 Low back pain, unspecified: Secondary | ICD-10-CM

## 2015-10-17 DIAGNOSIS — R6883 Chills (without fever): Secondary | ICD-10-CM

## 2015-10-17 DIAGNOSIS — R52 Pain, unspecified: Secondary | ICD-10-CM

## 2015-10-17 DIAGNOSIS — M549 Dorsalgia, unspecified: Secondary | ICD-10-CM

## 2015-10-17 DIAGNOSIS — F17210 Nicotine dependence, cigarettes, uncomplicated: Secondary | ICD-10-CM | POA: Insufficient documentation

## 2015-10-17 DIAGNOSIS — Z862 Personal history of diseases of the blood and blood-forming organs and certain disorders involving the immune mechanism: Secondary | ICD-10-CM | POA: Diagnosis not present

## 2015-10-17 DIAGNOSIS — M542 Cervicalgia: Secondary | ICD-10-CM

## 2015-10-17 DIAGNOSIS — R202 Paresthesia of skin: Secondary | ICD-10-CM | POA: Insufficient documentation

## 2015-10-17 DIAGNOSIS — M419 Scoliosis, unspecified: Secondary | ICD-10-CM | POA: Diagnosis not present

## 2015-10-17 DIAGNOSIS — M25551 Pain in right hip: Secondary | ICD-10-CM | POA: Insufficient documentation

## 2015-10-17 DIAGNOSIS — Z3202 Encounter for pregnancy test, result negative: Secondary | ICD-10-CM | POA: Diagnosis not present

## 2015-10-17 LAB — URINE MICROSCOPIC-ADD ON: BACTERIA UA: NONE SEEN

## 2015-10-17 LAB — CBC WITH DIFFERENTIAL/PLATELET
Basophils Absolute: 0 10*3/uL (ref 0.0–0.1)
Basophils Relative: 0 %
Eosinophils Absolute: 0 10*3/uL (ref 0.0–0.7)
Eosinophils Relative: 0 %
HEMATOCRIT: 31.7 % — AB (ref 36.0–46.0)
HEMOGLOBIN: 10.3 g/dL — AB (ref 12.0–15.0)
LYMPHS PCT: 19 %
Lymphs Abs: 1.5 10*3/uL (ref 0.7–4.0)
MCH: 29 pg (ref 26.0–34.0)
MCHC: 32.5 g/dL (ref 30.0–36.0)
MCV: 89.3 fL (ref 78.0–100.0)
MONO ABS: 0.7 10*3/uL (ref 0.1–1.0)
MONOS PCT: 9 %
NEUTROS ABS: 5.4 10*3/uL (ref 1.7–7.7)
NEUTROS PCT: 72 %
Platelets: 185 10*3/uL (ref 150–400)
RBC: 3.55 MIL/uL — ABNORMAL LOW (ref 3.87–5.11)
RDW: 13.5 % (ref 11.5–15.5)
WBC: 7.7 10*3/uL (ref 4.0–10.5)

## 2015-10-17 LAB — I-STAT BETA HCG BLOOD, ED (MC, WL, AP ONLY): I-stat hCG, quantitative: <5 m[IU]/mL (ref <5)

## 2015-10-17 LAB — RAPID URINE DRUG SCREEN, HOSP PERFORMED
Amphetamines: NOT DETECTED
Barbiturates: NOT DETECTED
Benzodiazepines: NOT DETECTED
Cocaine: NOT DETECTED
OPIATES: NOT DETECTED
Tetrahydrocannabinol: NOT DETECTED

## 2015-10-17 LAB — URINALYSIS, ROUTINE W REFLEX MICROSCOPIC
Bilirubin Urine: NEGATIVE
GLUCOSE, UA: NEGATIVE mg/dL
KETONES UR: NEGATIVE mg/dL
LEUKOCYTES UA: NEGATIVE
Nitrite: NEGATIVE
PROTEIN: NEGATIVE mg/dL
SPECIFIC GRAVITY, URINE: 1.016 (ref 1.005–1.030)
pH: 6 (ref 5.0–8.0)

## 2015-10-17 LAB — COMPREHENSIVE METABOLIC PANEL
ALBUMIN: 3.4 g/dL — AB (ref 3.5–5.0)
ALT: 19 U/L (ref 14–54)
ANION GAP: 8 (ref 5–15)
AST: 22 U/L (ref 15–41)
Alkaline Phosphatase: 60 U/L (ref 38–126)
BUN: 7 mg/dL (ref 6–20)
CHLORIDE: 106 mmol/L (ref 101–111)
CO2: 22 mmol/L (ref 22–32)
Calcium: 8.6 mg/dL — ABNORMAL LOW (ref 8.9–10.3)
Creatinine, Ser: 0.91 mg/dL (ref 0.44–1.00)
GFR calc non Af Amer: >60 mL/min (ref >60)
GLUCOSE: 90 mg/dL (ref 65–99)
POTASSIUM: 4.1 mmol/L (ref 3.5–5.1)
SODIUM: 136 mmol/L (ref 135–145)
Total Bilirubin: 0.9 mg/dL (ref 0.3–1.2)
Total Protein: 6.9 g/dL (ref 6.5–8.1)

## 2015-10-17 LAB — I-STAT CG4 LACTIC ACID, ED: LACTIC ACID, VENOUS: 0.83 mmol/L (ref 0.5–2.0)

## 2015-10-17 MED ORDER — LIDOCAINE 5 % EX PTCH: 1.0000 | MEDICATED_PATCH | CUTANEOUS | Status: DC

## 2015-10-17 MED ORDER — ONDANSETRON HCL 4 MG/2ML IJ SOLN
4.0000 mg | Freq: Once | INTRAMUSCULAR | Status: DC
  Filled 2015-10-17: qty 2

## 2015-10-17 MED ORDER — SODIUM CHLORIDE 0.9 % IV BOLUS (SEPSIS)
1000.0000 mL | Freq: Once | INTRAVENOUS | Status: AC
  Administered 2015-10-17: 1000 mL via INTRAVENOUS

## 2015-10-17 MED ORDER — PREGABALIN 75 MG PO CAPS
75.0000 mg | ORAL_CAPSULE | Freq: Once | ORAL | Status: AC
  Administered 2015-10-17: 75 mg via ORAL
  Filled 2015-10-17: qty 1

## 2015-10-17 MED ORDER — PREGABALIN 75 MG PO CAPS: 75.0000 mg | ORAL_CAPSULE | Freq: Two times a day (BID) | ORAL | Status: DC

## 2015-10-17 MED ORDER — KETOROLAC TROMETHAMINE 60 MG/2ML IM SOLN
60.0000 mg | Freq: Once | INTRAMUSCULAR | Status: AC
  Administered 2015-10-17: 60 mg via INTRAMUSCULAR
  Filled 2015-10-17: qty 2

## 2015-10-17 NOTE — ED Notes (Signed)
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  The pain then was worse in my legs than today."

## 2015-10-17 NOTE — ED Notes (Signed)
EDP aware of BP 83/57 at discharge.

## 2015-10-17 NOTE — ED Provider Notes (Signed)
CSN: [id number]     Arrival date & time 10/17/15  1125 History   First MD Initiated Contact with Patient 10/17/15 1232     Chief Complaint  Patient presents with  . body pain      (Consider location/radiation/quality/duration/timing/severity/associated sxs/prior Treatment) HPI   [name] is a 40 y.o. female, with a history of scoliosis, anemia, and sarcoidosis, presenting to the ED with full body pain with chills for the last 4 days. 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 This presented with bilateral lower extremity weakness and bladder incontinence. Patient denies having any similar symptoms today. Patient states that there doesn't seem to be a pattern to her body pain. Currently, however, patient's only complaint is lower back pain, consistent with her chronic back pain. Patient describes the pain as a pressure and rates it 7 out of 10. Patient denies recent falls or trauma. Further denies fever, nausea/vomiting, chest pain, abdominal pain, recent illness, shortness of breath, or any other complaints. Patient states that she has had Neurontin in the past and adds that it made her want to kill herself. Patient has had Lyrica in the past without difficulty.   Past Medical History  Diagnosis Date  . Anemia   . Sarcoidosis of lung (HCC) 2004  . Polyp, uterus corpus   . Scoliosis    Past Surgical History  Procedure Laterality Date  . Ovarian cyst removal  1995  . Hernia repair  1996    Left Inguinal  . Cholecystectomy  1997  . Tubal ligation     Family History  Problem Relation Age of Onset  . Hypertension Mother   . Cancer Maternal Uncle     bone cancer, liver cancer  . Cancer Maternal Grandmother     lung cancer  . Hypertension Maternal Grandmother    Social History  Substance Use Topics  . Smoking status: Current Every Day Smoker -- 0.00 packs/day for 0 years    Types: Cigarettes  . Smokeless tobacco: Never Used  . Alcohol Use: Yes      Comment: occasional   OB History    Gravida Para Term Preterm AB TAB SAB Ectopic Multiple Living   0 0 3     Review of Systems  Constitutional: Positive for chills. Negative for fever.  Eyes: Negative for visual disturbance.  Respiratory: Negative for cough and shortness of breath.   Cardiovascular: Negative for chest pain.  Gastrointestinal: Negative for nausea, vomiting, abdominal pain, diarrhea and constipation.  Musculoskeletal: Positive for myalgias and back pain.  Neurological: Negative for dizziness, weakness, light-headedness, numbness and headaches.  All other systems reviewed and are negative.     Allergies  Aspirin; Hydrocodone; and Orange concentrate  Home Medications   Prior to Admission medications   Medication Sig Start Date End Date Taking? Authorizing Provider  ibuprofen (ADVIL,MOTRIN) 200 MG tablet Take 200 mg by mouth every 6 (six) hours as needed for fever or mild pain.   Yes Historical Provider, MD  baclofen (LIORESAL) 10 MG tablet Take 1 tablet (10 mg total) by mouth at bedtime. 05/18/15   Jeralyn Bennett, MD  baclofen (LIORESAL) 10 MG tablet Take 0.5 tablets (5 mg total) by mouth daily. 05/18/15   Jeralyn Bennett, MD  lidocaine (LIDODERM) 5 % Place 1 patch onto the skin daily. Remove & Discard patch within 12 hours or as directed by MD 10/17/15   Anselm Pancoast, PA-C  pregabalin (LYRICA)  75 MG capsule Take 1 capsule (75 mg total) by mouth 2 (two) times daily. 10/17/15   Shelia Mungo C Equan Cogbill, PA-C   BP 92/57 mmHg  Pulse 68  Temp(Src) 99.1 F (37.3 C) (Oral)  Resp 18  Ht  (1.6 m)  Wt 58.968 kg  BMI 23.03 kg/m2  SpO2 100%  LMP 09/21/2015 (Approximate) Physical Exam  Constitutional: She is oriented to person, place, and time. She appears well-developed and well-nourished. No distress.  HENT:  Head: Normocephalic and atraumatic.  Eyes: Conjunctivae and EOM are normal. Pupils are equal, round, and reactive to light.  Neck: Normal range of motion.  Neck supple.  Cardiovascular: Normal rate, regular rhythm, normal heart sounds and intact distal pulses.   Pulmonary/Chest: Effort normal and breath sounds normal. No respiratory distress.  Abdominal: Soft. There is no tenderness. There is no guarding.  Musculoskeletal: She exhibits no edema or tenderness.  Patient's pain is not reproducible on palpation. Full ROM in all extremities and spine. No paraspinal tenderness.   Lymphadenopathy:    She has no cervical adenopathy.  Neurological: She is alert and oriented to person, place, and time. She has normal reflexes.  No sensory deficits. Strength 5/5 in all extremities. No gait disturbance. Coordination intact. Cranial nerves III-XII grossly intact. No facial droop.   Skin: Skin is warm and dry. She is not diaphoretic.  Psychiatric: She has a normal mood and affect. Her behavior is normal.  Nursing note and vitals reviewed.   ED Course  Procedures (including critical care time) Labs Review Labs Reviewed  COMPREHENSIVE METABOLIC PANEL - Abnormal; Notable for the following:    Calcium 8.6 (*)    Albumin 3.4 (*)    All other components within normal limits  CBC WITH DIFFERENTIAL/PLATELET - Abnormal; Notable for the following:    RBC 3.55 (*)    Hemoglobin 10.3 (*)    HCT 31.7 (*)    All other components within normal limits  URINALYSIS, ROUTINE W REFLEX MICROSCOPIC (NOT AT [facility]) - Abnormal; Notable for the following:    Hgb urine dipstick SMALL (*)    All other components within normal limits  URINE MICROSCOPIC-ADD ON - Abnormal; Notable for the following:    Squamous Epithelial / LPF 0-5 (*)    All other components within normal limits  URINE RAPID DRUG SCREEN, HOSP PERFORMED  I-STAT BETA HCG BLOOD, ED ([facility], [facility], AP ONLY)  I-STAT CG4 LACTIC ACID, ED    Imaging Review No results found. I have personally reviewed and evaluated these lab results as part of my medical decision-making.   EKG Interpretation None       Medications   ondansetron (ZOFRAN) injection 4 mg (4 mg Intravenous Not Given 10/17/15 1436)  ketorolac (TORADOL) injection 60 mg (60 mg Intramuscular Given 10/17/15 1338)  pregabalin (LYRICA) capsule 75 mg (75 mg Oral Given 10/17/15 1337)  sodium chloride 0.9 % bolus 1,000 mL (0 mLs Intravenous Stopped 10/17/15 1556)  sodium chloride 0.9 % bolus 1,000 mL (1,000 mLs Intravenous New Bag/Given 10/17/15 1617)   Orders Placed This Encounter  Procedures  . Comprehensive metabolic panel  . CBC with Differential  . Urinalysis, Routine w reflex microscopic  . Urine rapid drug screen (hosp performed)  . Urine microscopic-add on  . Ambulate patient  . Orthostatic vital signs  . Check Rectal Temperature  . I-Stat beta hCG blood, ED  . I-Stat CG4 Lactic Acid, ED  . Insert peripheral IV    Orthostatic VS for the past 24  hrs:  BP- Lying Pulse- Lying BP- Sitting Pulse- Sitting BP- Standing at 0 minutes Pulse- Standing at 0 minutes  10/17/15 1556 (!) 83/51 mmHg 66 (!) 89/52 mmHg 88 (!) 89/61 mmHg 99      MDM   Final diagnoses:  Bilateral low back pain without sciatica  Body aches  Chills    [name] presents with body aches and chills for the last 4 days. Patient also complains of her chronic back pain.  Findings and plan of care discussed with Tilden FossaElizabeth Rees, MD and then with Tyrone AppleEmily Nguyen, MD after EDP shift change. Dr. Madilyn Hookees personally evaluated and examined this patient.  [facility] reveals no evidence of a diagnosed spinal cord thrombosis. I suspect that this patient is having an exacerbation of her chronic back pain, combined with a possible viral infection. When asked about her noted hypotension, patient replies, "my blood pressure is always low." Patient has no signs of symptomatic hypertension. Upon reassessment, patient states that her pain is very much improved and she would feel comfortable with discharge. When patient was ambulated to the  restroom, she mentioned to the nurse that she felt dizzy. Patient found to have orthostatic changes in the form of increased pulse from 66 to 99. 4:30 PM End of shift patient care handoff report given to [facility] Ward, PA-C.  Plan: Administer second fluid bolus, repeat orthostatics, ambulate patient, and if normal, discharge patient with close PCP follow-up. Should patient continue to have orthostatic changes, patient may have to be admitted.  Filed Vitals:   10/17/15 1315 10/17/15 1330 10/17/15 1400 10/17/15 1415  BP: 83/54 85/52 93/62  93/68  Pulse: 61 62    Temp:      TempSrc:      Resp:      Height:      Weight:      SpO2: 100% 97%     Filed Vitals:   10/17/15 1445 10/17/15 1515 10/17/15 1545 10/17/15 1615  BP: 83/45 88/53 83/51  92/57  Pulse: 70 84 69 68  Temp:      TempSrc:      Resp:      Height:      Weight:      SpO2: 99% 100% 100% 100%     Anselm PancoastShawn C Timmie Calix, PA-C 10/17/15 1633  Tilden FossaElizabeth Rees, MD 10/25/15 [phone]

## 2015-10-17 NOTE — ED Notes (Signed)
Pt ambulated to hallway.  Gait even.  Pt reported mild dizziness.

## 2015-10-17 NOTE — ED Provider Notes (Signed)
Care assumed from previous provider PA Joy, case discussed, plan agreed upon. Patient came to ED for back pain c/w her chronic back pain, also complaining of 4 day history of body aches/chills. All labs reviewed by me and are reassuring. Patient currently receiving 2nd fluid bolus. Will re-check orthostatics after 2nd liter and reassess patient. If orthostatics and able to ambulate, plan to discharge home with close PCP follow up.   Rectal temp obtained- 98. Patient is afebrile, white count wdl, heart rate wdl. Does not meet SIRS criteria.   Gen: afebrile, VSS, NAD HEENT: EOMI, MMM Resp: no resp distress CV: RRR, no MRG Abd: soft, NT, ND MsK: moving all extremities well Neuro: A&O x4  Orthostatics repeated and improved.   Patient appears safe for discharge to home at this time. PCP follow up strongly encouraged.   Patient discussed with Dr. Cyndie ChimeNguyen who agrees with treatment plan.   [facility] Pilcher Malanie Koloski, PA-C 10/17/15 1901  Leta BaptistEmily Roe Nguyen, MD 10/18/15 [phone]

## 2015-10-17 NOTE — ED Notes (Signed)
Pt. Presents with multiple complaints : posterior neck pain , pain at entire back , headache , bilateral hip pain , legs and feet tingling onset Thursday this week . Denies fever or chills , ambulatory , respirations unlabored.

## 2015-10-17 NOTE — Discharge Instructions (Signed)
You have been seen today for back pain. Your lab tests showed no abnormalities. Follow up with PCP as needed should symptoms continue. Return to ED should symptoms worsen.  RESOURCE GUIDE  Chronic Pain Problems: Contact [facility]  [phone] Patients need to be referred by their primary care doctor.  Insufficient Money for Medicine: Contact United Way:  call "211" or [facility] [phone].  No Primary Care Doctor: - Call [facility]  [phone] - can help you locate a primary care doctor that  accepts your insurance, provides certain services, etc. - Physician Referral Service- [phone]  Agencies that provide inexpensive medical care: - [facility]  [phone] - [facility]  [phone] - [facility]  [phone] - Women's Clinic  [phone] - Planned Parenthood  [phone] [facility]  [phone]  Medicaid-accepting [facility] Providers: - [facility]- [address]  [phone], Mon-Fri 9am-7pm, Sat 9am-1pm - [facility]- [address] [place], [address] [place]  [phone] - [facility]- [address] [place]  [phone] [facility]- [address]  [phone] - Renaye Rakers- [address] [place], [address], [phone]  Only accepts [place] patients after they have their name  applied to their card  Self Pay (no insurance) in [place]: - Sickle Cell Patients: Dr Willey Blade, [facility]  [address] [place], [phone] - [facility]- [address] [place]  [phone]       [facility]- [address]       -     [facility]- see information above (Speak to [employer] if you do not have insurance)       -  [facility]- [address] [place], [phone]       -  [facility]- [address],  [phone]        -  [facility]- [address], [phone]       -  Dr Julio Sicks-  [address], [place], [phone]       -  [facility]- [address], [phone]       -  [facility]- [address] [phone], also [address], [phone]       -    [facility]- [address] [place], [phone], 1st & 3rd Saturday   every month, 10am-1pm  1) Find a Doctor and Pay Out of Pocket Although you won't have to find out who is covered by your insurance plan, it is a good idea to ask around and get recommendations. You will then need to call the office and see if the doctor you have chosen will accept you as a new patient and what types of options they offer for patients who are self-pay. Some doctors offer discounts or will set up payment plans for their patients who do not have insurance, but you will need to ask so you aren't surprised when you get to your appointment.  2) Contact Your Local Health Department Not all health departments have doctors that can see patients for sick visits, but many do, so it is worth a call to see if yours does. If you don't know where your local  health department is, you can check in your phone book. The CDC also has a tool to help you locate your state's health department, and many state websites also have listings of all of their local health departments.  3) Find a Walk-in Clinic If your illness is not likely to be very severe or complicated, you may want to try a walk in clinic. These are popping up all over the country in pharmacies, drugstores, and shopping centers. They're usually staffed by nurse practitioners or physician assistants that have been trained to treat common illnesses and complaints. They're usually fairly quick and inexpensive. However, if you have serious medical issues or chronic medical problems, these are probably not your best option  STD Testing - [facility] Department of  [facility] [place], STD Clinic, [address], [place], phone [phone] or [phone].  Monday - Friday, call for an appointment. [facility]- [place] Department of [facility] [place], STD Clinic, [place], [place], phone [phone] or [phone].  Monday - Friday, call for an appointment.  Abuse/Neglect: [facility]- [place] Child Abuse Hotline 2264407364(336) 325-048-0924 [facility]- [place] Child Abuse Hotline 667 672 1752216-380-1696 (After Hours)  Emergency Shelter:  [facility] [phone](336) [phone]  Maternity Homes: - [facility] [phone] - [facility] [phone])[phone](704) [phone]  MRSA Hotline #:   3191906927(413)187-7506  [facility] Resources  [facility]  United Way [facility] [address]                 [address]         [address]  [place]                                               [place]                              [place] Phone:  [phone]                                  Phone:  [phone]                   Phone:  [phone]  [facility], [phone] - [facility] Services - [phone]- [phone]       -     [facility] in [place], [address],                                  [phone], [facility]  [place] Child Abuse Hotline 240-085-0776(336) 8187458971 or 517-380-9712(336) (669) 321-4319 (After Hours)   Behavioral Health Services  Substance Abuse Resources: - [facility] Drug Services  ([phone] - [facility] [phone] - [facility] [phone] Floydene Flock- [facility] [phone] - Residential & Outpatient Substance Abuse Program  [phone]  Psychological Services: Tressie Ellis- [facility]  [phone] - [facility]  [phone] - [facility], [phone] [place] [address], [place], ACCESS LINE: [phone] or [phone],  [email or web address]  Dental Assistance  If unable to pay or uninsured, contact:  [facility] or [facility]  to become qualified for the adult dental clinic.  Patients with Medicaid: [facility] ([phone] [address], [phone] [address], [phone]  If unable to pay, or uninsured, contact [facility] [phone]) or [facility] Department [phone] in [place], [phone] in [facility]) to become qualified for the adult dental clinic   Other Low-Cost Community Dental Services: - [facility]- [address] [place], [place], [place], [address], [phone], Ext. [address], 2nd and 4th Thursday of the month at 6:30am.  10 clients each day by appointment, can sometimes see walk-in patients if someone does not show for an appointment. [facility]- [address] Ether Griffins [place], [place], [address], [phone] - [facility]- [address] [place], [place], [address], [phone] - [facility]- [phone] [facility] Department- [phone] [facility] Department- [phone]

## 2015-10-17 NOTE — ED Notes (Signed)
Patient here with headache and generalized body pain since Thursday. Low grade fever with same. No cough, no congestion

## 2016-10-13 IMAGING — CT CT T SPINE W/O CM
2 of 7 series · 6 of 36 positions shown · non-contrast
Comparison: MRI exams dated 05/15/2015 and 05/14/2015.

CLINICAL DATA: Left leg weakness, abnormality noted at T9.

Recent MRI is describing a probable chronic diastematomyelia with
cystic myelomalacia at the T9 level.
EXAM:
CT THORACIC SPINE WITHOUT CONTRAST
TECHNIQUE: Multidetector CT imaging of the thoracic spine was performed without
intravenous contrast administration. Multiplanar CT image
reconstructions were also generated.

[Series 2012: st sagittal upper · sagittal · 0.26mm/px · 2 of 50 slices shown]
[im 17/50  bone]
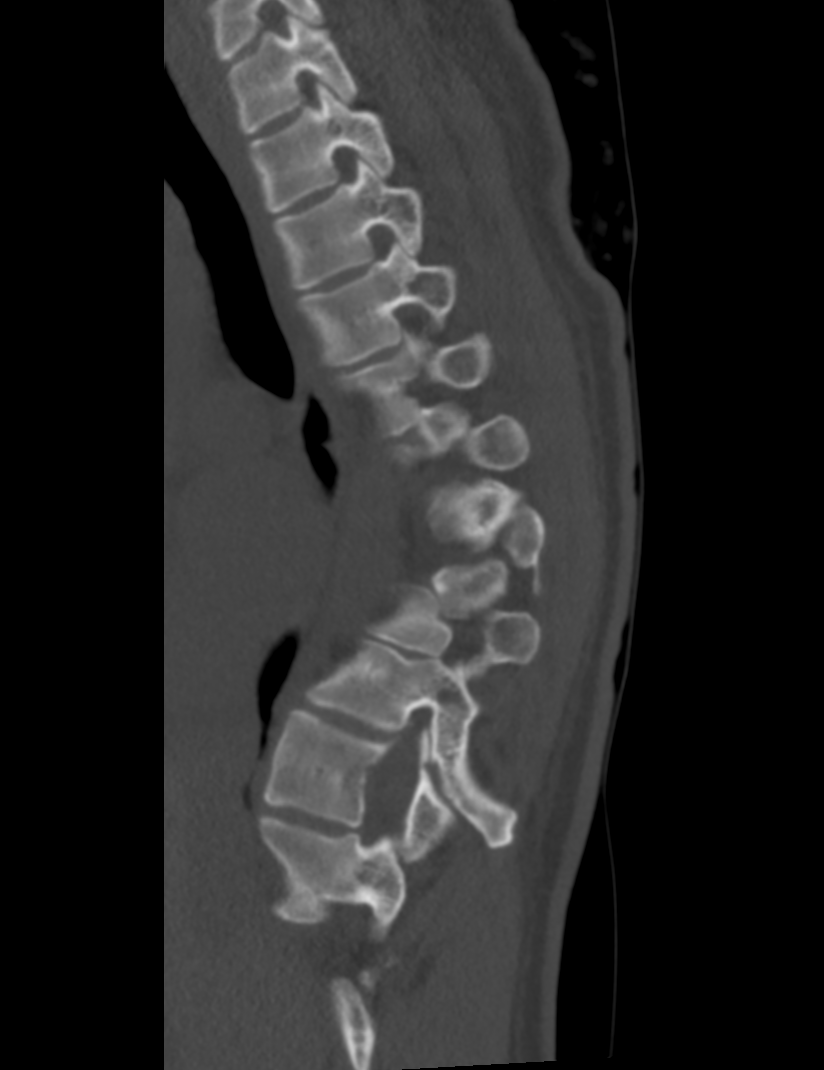
[im 33/50  bone]
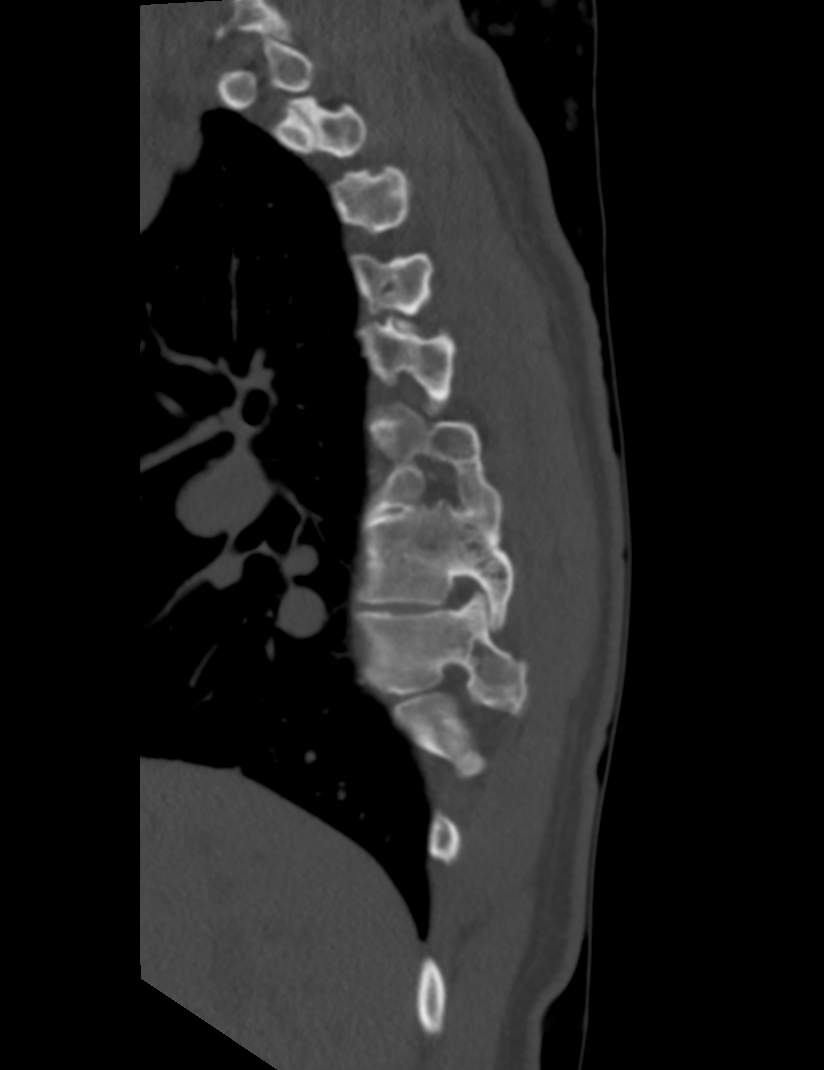

[Series 2013: st sagittal lower · sagittal · 0.26mm/px · 4 of 53 slices shown]
[im 14/53  bone]
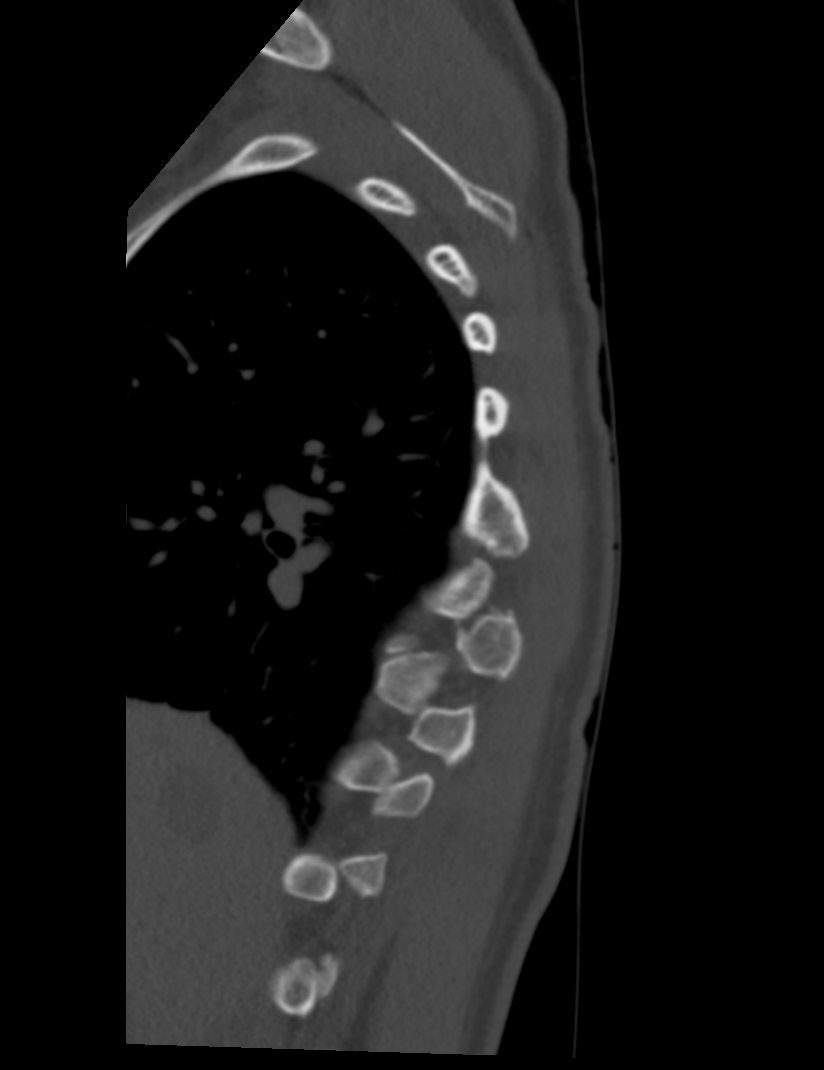
[im 27/53  bone]
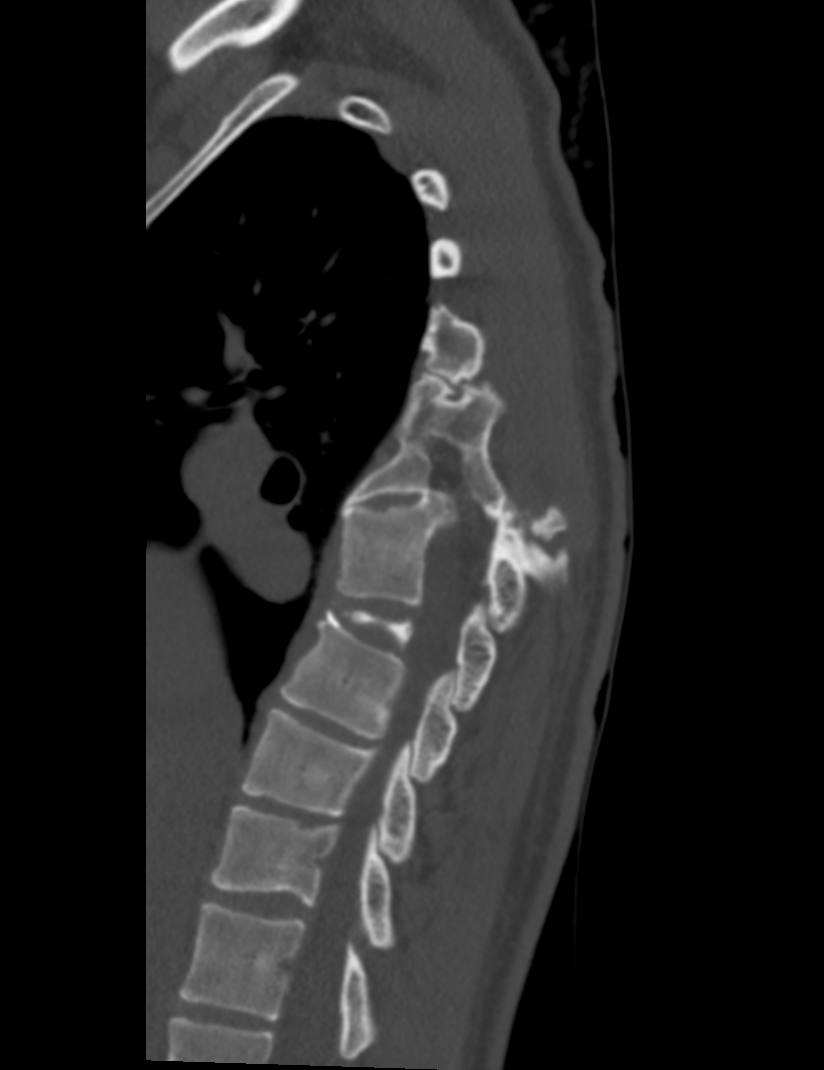
[im 30/53  soft-tissue]
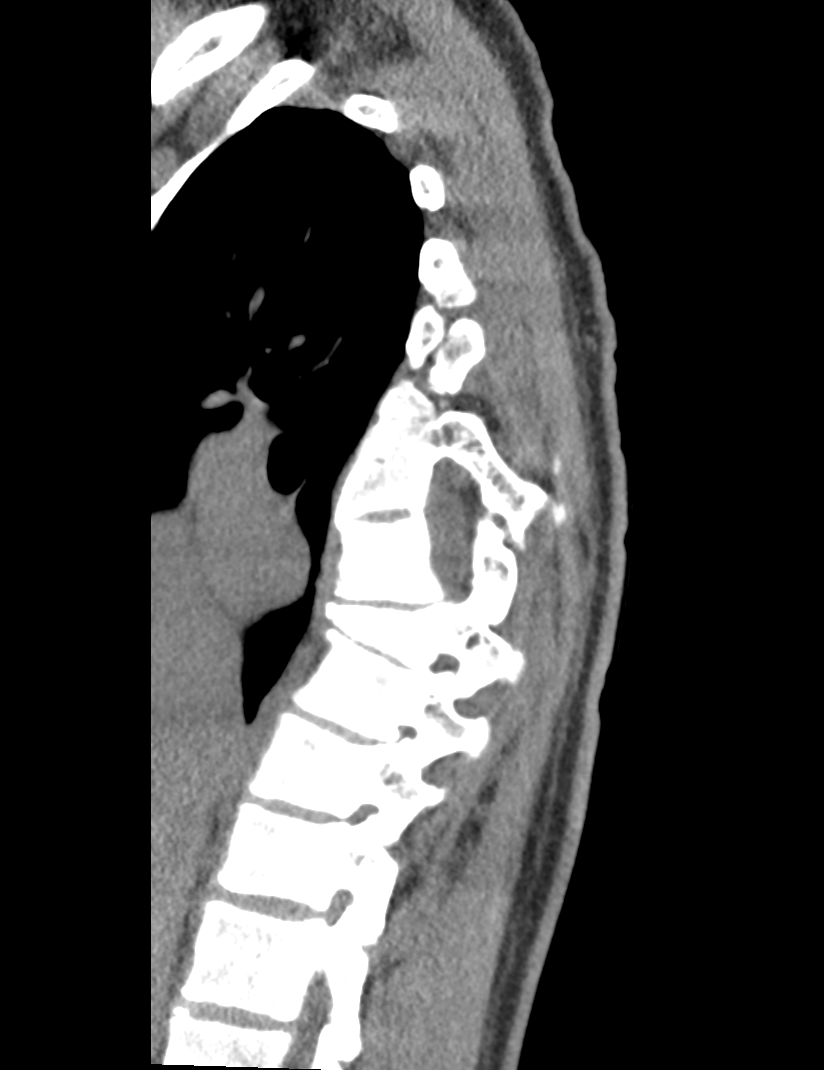
[im 40/53  bone]
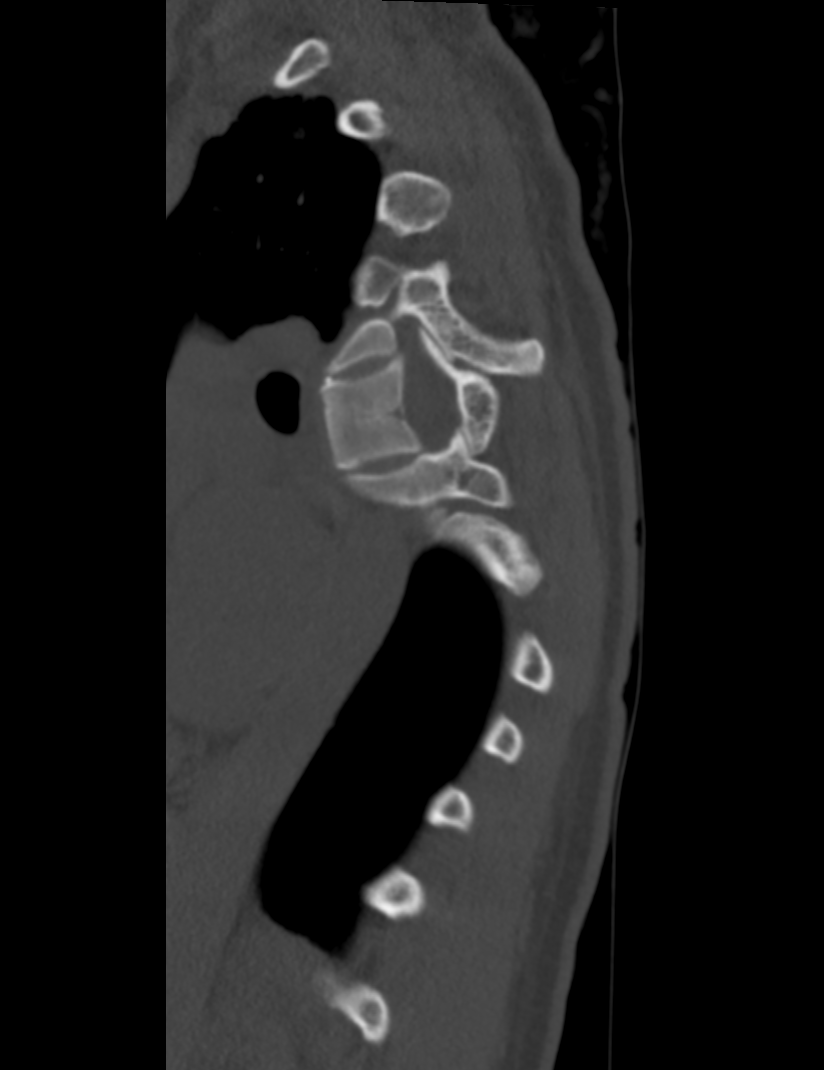

[6 of 36 positions shown; findings below may reference images not displayed]

FINDINGS: As shown on the earlier MRI exams, there is a T8 butterfly vertebra
configuration with associated kyphosis and dextroscoliosis.
Additional chronic osseous fusions noted at the right T3 through T7
costovertebral junctions, likely contributing to the scoliosis.

There is no acute-appearing cortical irregularity or osseous lesion.
No suspicious osseous lesion. Overall bone mineralization is normal.
No fracture line or displaced fracture fragment. Paravertebral soft
tissues are unremarkable, evaluation somewhat limited by the lack of
intravascular contrast. Visualized portions of the lungs are clear.
IMPRESSION: Chronic/congenital changes described above, including T8 butterfly
vertebral body and costovertebral fusions contributing to patient's
thoracic spine kyphosis and dextroscoliosis.

Paravertebral soft tissues are unremarkable.

Probable diastematomyelia with cystic myelomalacia demonstrated
within the cord at the T9 vertebral body level on earlier MRI.

No acute findings.

## 2017-06-23 ENCOUNTER — Encounter: Payer: Self-pay | Admitting: Emergency Medicine

## 2017-06-23 ENCOUNTER — Emergency Department
Admission: EM | Admit: 2017-06-23 | Discharge: 2017-06-23 | Disposition: A | Discharge status: Home / Self Care | Payer: Self-pay | Source: Home / Self Care

## 2017-06-23 ENCOUNTER — Other Ambulatory Visit: Payer: Self-pay

## 2017-06-23 DIAGNOSIS — M25512 Pain in left shoulder: Secondary | ICD-10-CM

## 2017-06-23 HISTORY — DX: Cervicalgia: M54.2

## 2017-06-23 MED ORDER — PREDNISONE 10 MG PO TABS: ORAL_TABLET | ORAL | 0 refills | Status: DC

## 2017-06-23 MED ORDER — DICLOFENAC SODIUM 75 MG PO TBEC: 75.0000 mg | DELAYED_RELEASE_TABLET | Freq: Two times a day (BID) | ORAL | 0 refills | Status: DC

## 2017-06-23 MED ORDER — OXYCODONE-ACETAMINOPHEN 5-325 MG PO TABS: 1.0000 | ORAL_TABLET | ORAL | 0 refills | Status: DC | PRN

## 2017-06-23 NOTE — ED Triage Notes (Signed)
Patient reports injuring left shoulder/ unknown cause in 01/2017; cleared spontaneously. Over past week pain has returned to left shoulder; she is taking ibuprofen 800mg  q 8 hours with last dose 2 hours ago. Guarding her left arm/shoulder.

## 2017-06-23 NOTE — ED Provider Notes (Signed)
[facility]    CSN: [id number] Arrival date & time: 06/23/17  1707     History   Chief Complaint Chief Complaint  Patient presents with  . Shoulder Pain    left    HPI [name] is a 41 y.o. female.   The history is provided by the patient. No language interpreter was used.  Shoulder Pain  Location:  Shoulder Shoulder location:  L shoulder Injury: no   Pain details:    Quality:  Aching   Radiates to:  Does not radiate   Severity:  Moderate   Timing:  Constant Handedness:  Left-handed Foreign body present:  Unable to specify Prior injury to area:  Yes Relieved by:  Nothing Worsened by:  Nothing Ineffective treatments:  None tried Associated symptoms: back pain     Past Medical History:  Diagnosis Date  . Anemia   . Cervical spine pain   . Polyp, uterus corpus   . Sarcoidosis of lung (HCC) 2004  . Scoliosis     Patient Active Problem List   Diagnosis Date Noted  . Abnormality of gait   . Coordination impairment   . Leg weakness, bilateral   . Abnormal MRI, thoracic spine 05/14/2015  . Weakness 05/14/2015  . Chronic anemia 05/14/2015  . Pelvic pain in female 02/01/2012  . BLURRED VISION 10/21/2007  . MYALGIA 10/21/2007  . SOB 10/21/2007  . Sarcoidosis 12/20/2006  . 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 BACKACHE NOS 12/20/2006    Past Surgical History:  Procedure Laterality Date  . CHOLECYSTECTOMY  1997  . HERNIA REPAIR  1996   Left Inguinal  . OVARIAN CYST REMOVAL  1995  . OVARIAN CYST REMOVAL    . TUBAL LIGATION      OB History    Gravida Para Term Preterm AB Living   5 4 4   1 3    SAB TAB Ectopic Multiple Live Births   1   0 0         Home Medications    Prior to Admission medications   Medication Sig Start Date End Date Taking? Authorizing Provider  baclofen (LIORESAL) 10 MG tablet Take 1 tablet (10 mg total) by mouth at bedtime. 05/18/15   Jeralyn BennettZamora, Ezequiel, MD  baclofen (LIORESAL) 10 MG tablet Take 0.5 tablets (5  mg total) by mouth daily. 05/18/15   Jeralyn BennettZamora, Ezequiel, MD  diclofenac (VOLTAREN) 75 MG EC tablet Take 1 tablet (75 mg total) by mouth 2 (two) times daily. 06/23/17   Elson AreasSofia, Leslie K, PA-C  ibuprofen (ADVIL,MOTRIN) 200 MG tablet Take 200 mg by mouth every 6 (six) hours as needed for fever or mild pain.    [provider]  lidocaine (LIDODERM) 5 % Place 1 patch onto the skin daily. Remove & Discard patch within 12 hours or as directed by MD 10/17/15   Joy, Hillard DankerShawn C, PA-C  oxyCODONE-acetaminophen (PERCOCET/ROXICET) 5-325 MG tablet Take 1-2 tablets by mouth every 4 (four) hours as needed for severe pain. 06/23/17   Elson AreasSofia, Leslie K, PA-C  predniSONE (DELTASONE) 10 MG tablet 6,5,4,3,2,1 taper 06/23/17   Elson AreasSofia, Leslie K, PA-C  pregabalin (LYRICA) 75 MG capsule Take 1 capsule (75 mg total) by mouth 2 (two) times daily. 10/17/15   Joy, Hillard DankerShawn C, PA-C    Family History Family History  Problem Relation Age of Onset  . Cancer Maternal Uncle        bone cancer, liver cancer  . Hypertension Mother   . Cancer Maternal  Grandmother        lung cancer  . Hypertension Maternal Grandmother     Social History Social History   Tobacco Use  . Smoking status: Current Every Day Smoker    Packs/day: 0.00    Years: 0.00    Pack years: 0.00    Types: Cigarettes  . Smokeless tobacco: Never Used  Substance Use Topics  . Alcohol use: Yes    Comment: occasional  . Drug use: No     Allergies   Aspirin; Hydrocodone; and Orange concentrate [flavoring agent]   Review of Systems Review of Systems  Musculoskeletal: Positive for back pain.  All other systems reviewed and are negative.    Physical Exam Triage Vital Signs ED Triage Vitals  Enc Vitals Group     BP 06/23/17 1733 108/67     Pulse Rate 06/23/17 1733 76     Resp --      Temp 06/23/17 1733 98 F (36.7 C)     Temp Source 06/23/17 1733 Oral     SpO2 06/23/17 1733 100 %     Weight 06/23/17 1733 128 lb (58.1 kg)     Height 06/23/17  1733 5\' 3"  (1.6 m)     Head Circumference --      Peak Flow --      Pain Score 06/23/17 1734 8     Pain Loc --      Pain Edu? --      Excl. in GC? --    No data found.  Updated Vital Signs BP 108/67 (BP Location: Right Arm)   Pulse 76   Temp 98 F (36.7 C) (Oral)   Ht 5\' 3"  (1.6 m)   Wt 128 lb (58.1 kg)   LMP 06/16/2017 (Exact Date)   SpO2 100%   BMI 22.67 kg/m   Visual Acuity Right Eye Distance:   Left Eye Distance:   Bilateral Distance:    Right Eye Near:   Left Eye Near:    Bilateral Near:     Physical Exam  Constitutional: She appears well-developed and well-nourished. No distress.  HENT:  Head: Normocephalic and atraumatic.  Eyes: Conjunctivae are normal.  Neck: Neck supple.  Cardiovascular: Normal rate.  No murmur heard. Pulmonary/Chest: Effort normal. No respiratory distress.  Abdominal: There is no tenderness.  Musculoskeletal: She exhibits no edema.  Limited range of motion,  Swollen anterior shoulder,  Tender to palpation nv and ns intact  Neurological: She is alert.  Skin: Skin is warm and dry.  Psychiatric: She has a normal mood and affect.  Nursing note and vitals reviewed.    UC Treatments / Results  Labs (all labs ordered are listed, but only abnormal results are displayed) Labs Reviewed - No data to display  EKG  EKG Interpretation None       Radiology No results found.  Procedures Procedures (including critical care time)  Medications Ordered in UC Medications - No data to display   Initial Impression / Assessment and Plan / UC Course  I have reviewed the triage vital signs and the nursing notes.  Pertinent labs & imaging results that were available during my care of the patient were reviewed by me and considered in my medical decision making (see chart for details).     I do not think xray will be of benefit,  I suspect tendonitis or bursitis .   Possible developing frozen shoulder.  I will refer to Sports Medicine to  see this week.  Final Clinical Impressions(s) / UC Diagnoses   Final diagnoses:  Acute pain of left shoulder    ED Discharge Orders        Ordered    predniSONE (DELTASONE) 10 MG tablet     06/23/17 1746    oxyCODONE-acetaminophen (PERCOCET/ROXICET) 5-325 MG tablet  Every 4 hours PRN     06/23/17 1746    diclofenac (VOLTAREN) 75 MG EC tablet  2 times daily     06/23/17 1747       Controlled Substance Prescriptions [place] Controlled Substance Registry consulted? Yes, I have consulted the [place] Controlled Substances Registry for this patient, and feel the risk/benefit ratio today is favorable for proceeding with this prescription for a controlled substance.   Elson AreasSofia, Leslie K, [place]PA-C 06/23/17 1752

## 2017-07-02 ENCOUNTER — Encounter: Payer: Self-pay | Admitting: Family Medicine

## 2017-07-02 DIAGNOSIS — Z0189 Encounter for other specified special examinations: Secondary | ICD-10-CM

## 2017-07-10 ENCOUNTER — Encounter: Payer: Self-pay | Admitting: Family Medicine

## 2017-07-10 ENCOUNTER — Ambulatory Visit (INDEPENDENT_AMBULATORY_CARE_PROVIDER_SITE_OTHER): Payer: Self-pay | Admitting: Family Medicine

## 2017-07-10 VITALS — BP 118/79 | HR 92 | Ht 63.5 in | Wt 134.0 lb

## 2017-07-10 DIAGNOSIS — M25512 Pain in left shoulder: Secondary | ICD-10-CM

## 2017-07-10 NOTE — Progress Notes (Signed)
[name] is a 42 y.o. female who [facility] today for left shoulder pain.  Patient has several month history of left shoulder pain. 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 At that time she felt a 'tearing' or 'pulling' sensation. She was sore for several weeks but eventually improved somewhat. 3 weeks ago however, patient was taking off shirt and heard a popping noise from her left shoulder. Initially, she actually received pain relief, but by the next morning her shoulder was significantly swollen and extremely painful. She went to urgent care, where she was prescribed a 5 day course of prednisone and placed in a sling for 2 weeks. She has been out of the sling for the past week and feels much improved. She is back to work and functioning well. She does have some residual soreness in her deep anterosuperior shoulder.   Patient here to ensure that she doesn't need further intervention. Otherwise, she feels that the pain is improving.    Past Medical History:  Diagnosis Date  . Anemia   . Cervical spine pain   . Polyp, uterus corpus   . Sarcoidosis of lung (HCC) 2004  . Scoliosis    Past Surgical History:  Procedure Laterality Date  . CHOLECYSTECTOMY  1997  . HERNIA REPAIR  1996   Left Inguinal  . OVARIAN CYST REMOVAL  1995  . OVARIAN CYST REMOVAL    . TUBAL LIGATION     Social History   Tobacco Use  . Smoking status: Current Every Day Smoker    Packs/day: 0.00    Years: 0.00    Pack years: 0.00    Types: Cigarettes  . Smokeless tobacco: Never Used  Substance Use Topics  . Alcohol use: Yes    Comment: occasional   ROS:  As above  Medications: Current Outpatient Medications  Medication Sig Dispense Refill  . baclofen (LIORESAL) 10 MG tablet Take 1 tablet (10 mg total) by mouth at bedtime. 30 each 0  . baclofen (LIORESAL) 10 MG tablet Take 0.5 tablets (5 mg total)  by mouth daily. 30 each 0  . diclofenac (VOLTAREN) 75 MG EC tablet Take 1 tablet (75 mg total) by mouth 2 (two) times daily. 20 tablet 0  . ibuprofen (ADVIL,MOTRIN) 200 MG tablet Take 200 mg by mouth every 6 (six) hours as needed for fever or mild pain.    Marland Kitchen. lidocaine (LIDODERM) 5 % Place 1 patch onto the skin daily. Remove & Discard patch within 12 hours or as directed by MD 30 patch 0  . oxyCODONE-acetaminophen (PERCOCET/ROXICET) 5-325 MG tablet Take 1-2 tablets by mouth every 4 (four) hours as needed for severe pain. 10 tablet 0  . predniSONE (DELTASONE) 10 MG tablet 6,5,4,3,2,1 taper 21 tablet 0  . pregabalin (LYRICA) 75 MG capsule Take 1 capsule (75 mg total) by mouth 2 (two) times daily. 60 capsule 1   No current facility-administered medications for this visit.    Allergies  Allergen Reactions  . Aspirin     REACTION: GI upset  . Hydrocodone     REACTION: Chest pain  . Orange Concentrate [Flavoring Agent] Rash   Exam:  BP 118/79   Pulse 92   Ht 5' 3.5" (1.613 m)   Wt 134 lb (60.8 kg)   LMP 06/16/2017 (Exact Date)   BMI 23.36 kg/m  General: Well Developed, well nourished, and in no acute distress.  Neuro/Psych:  Alert and oriented x3, extra-ocular muscles intact, able to move all 4 extremities, sensation grossly intact. Skin: Warm and dry, no rashes noted.  Respiratory: Not using accessory muscles, speaking in full sentences, trachea midline.  Cardiovascular: Pulses palpable, no extremity edema. Abdomen: Does not appear distended. MSK:  L shoulder: Normal in appearance without atrophy or swelling. Normal range of motion, with minimal pain. TTP bicipital groove Negative Hawkins. Mildly positive Neers. Mildly positive yergeson's and speed's  Full strength on extension/flexion/external rotation/internal rotation with minimal pain.  No results found for this or any previous visit (from the past 48 hour(s)). No results found.  Assessment and Plan: 42 y.o. female with  possible bicipital tendon subluxation vs impingement. Patient's signs and symptoms localize to the insertion of the bicipital groove. A subluxation or partial tear could explain popping noise and swelling. No popeye sign so true bicipital tendon tear is unlikely.   Patient is improving since injury 3 weeks ago. Pain is minimal. She does have some soreness with exertion, but she has improved significantly since injury. Patient will benefit from home exercise with elastic band. She would like to try home exercises before advancing to PT, imaging, injection. This seems to be a reasonable decision at this time.  She can return to follow up if not improving.  No orders of the defined types were placed in this encounter.  No orders of the defined types were placed in this encounter.  Discussed warning signs or symptoms. Please see discharge instructions. Patient expresses understanding.

## 2017-07-10 NOTE — Patient Instructions (Signed)
Thank you for coming in today. Start the home exercises.  Recheck as needed if not better.    Shoulder Impingement Syndrome Rehab Ask your health care provider which exercises are safe for you. Do exercises exactly as told by your health care provider and adjust them as directed. It is normal to feel mild stretching, pulling, tightness, or discomfort as you do these exercises, but you should stop right away if you feel sudden pain or your pain gets worse.Do not begin these exercises until told by your health care provider. Stretching and range of motion exercise This exercise warms up your muscles and joints and improves the movement and flexibility of your shoulder. This exercise also helps to relieve pain and stiffness. Exercise A: Passive horizontal adduction  1. Sit or stand and pull your left / right elbow across your chest, toward your other shoulder. Stop when you feel a gentle stretch in the back of your shoulder and upper arm. ? Keep your arm at shoulder height. ? Keep your arm as close to your body as you comfortably can. 2. Hold for __________ seconds. 3. Slowly return to the starting position. Repeat __________ times. Complete this exercise __________ times a day. Strengthening exercises These exercises build strength and endurance in your shoulder. Endurance is the ability to use your muscles for a long time, even after they get tired. Exercise B: External rotation, isometric 1. Stand or sit in a doorway, facing the door frame. 2. Bend your left / right elbow and place the back of your wrist against the door frame. Only your wrist should be touching the frame. Keep your upper arm at your side. 3. Gently press your wrist against the door frame, as if you are trying to push your arm away from your abdomen. ? Avoid shrugging your shoulder while you press your hand against the door frame. Keep your shoulder blade tucked down toward the middle of your back. 4. Hold for __________  seconds. 5. Slowly release the tension, and relax your muscles completely before you do the exercise again. Repeat __________ times. Complete this exercise __________ times a day. Exercise C: Internal rotation, isometric  1. Stand or sit in a doorway, facing the door frame. 2. Bend your left / right elbow and place the inside of your wrist against the door frame. Only your wrist should be touching the frame. Keep your upper arm at your side. 3. Gently press your wrist against the door frame, as if you are trying to push your arm toward your abdomen. ? Avoid shrugging your shoulder while you press your hand against the door frame. Keep your shoulder blade tucked down toward the middle of your back. 4. Hold for __________ seconds. 5. Slowly release the tension, and relax your muscles completely before you do the exercise again. Repeat __________ times. Complete this exercise __________ times a day. Exercise D: Scapular protraction, supine  1. Lie on your back on a firm surface. Hold a __________ weight in your left / right hand. 2. Raise your left / right arm straight into the air so your hand is directly above your shoulder joint. 3. Push the weight into the air so your shoulder lifts off of the surface that you are lying on. Do not move your head, neck, or back. 4. Hold for __________ seconds. 5. Slowly return to the starting position. Let your muscles relax completely before you repeat this exercise. Repeat __________ times. Complete this exercise __________ times a day. Exercise E: Scapular  retraction  1. Sit in a stable chair without armrests, or stand. 2. Secure an exercise band to a stable object in front of you so the band is at shoulder height. 3. Hold one end of the exercise band in each hand. Your palms should face down. 4. Squeeze your shoulder blades together and move your elbows slightly behind you. Do not shrug your shoulders while you do this. 5. Hold for __________  seconds. 6. Slowly return to the starting position. Repeat __________ times. Complete this exercise __________ times a day. Exercise F: Shoulder extension  1. Sit in a stable chair without armrests, or stand. 2. Secure an exercise band to a stable object in front of you where the band is above shoulder height. 3. Hold one end of the exercise band in each hand. 4. Straighten your elbows and lift your hands up to shoulder height. 5. Squeeze your shoulder blades together and pull your hands down to the sides of your thighs. Stop when your hands are straight down by your sides. Do not let your hands go behind your body. 6. Hold for __________ seconds. 7. Slowly return to the starting position. Repeat __________ times. Complete this exercise __________ times a day. This information is not intended to replace advice given to you by your health care provider. Make sure you discuss any questions you have with your health care provider. Document Released: 06/19/2005 Document Revised: 02/24/2016 Document Reviewed: 05/22/2015 Elsevier Interactive Patient Education  [employer].

## 2017-08-30 ENCOUNTER — Emergency Department
Admission: EM | Admit: 2017-08-30 | Discharge: 2017-08-30 | Disposition: A | Discharge status: Home / Self Care | Payer: Self-pay | Source: Home / Self Care

## 2017-08-30 ENCOUNTER — Encounter: Payer: Self-pay | Admitting: Emergency Medicine

## 2017-08-30 ENCOUNTER — Other Ambulatory Visit: Payer: Self-pay

## 2017-08-30 DIAGNOSIS — J0120 Acute ethmoidal sinusitis, unspecified: Secondary | ICD-10-CM

## 2017-08-30 MED ORDER — AMOXICILLIN 500 MG PO CAPS: 500.0000 mg | ORAL_CAPSULE | Freq: Three times a day (TID) | ORAL | 0 refills | Status: DC

## 2017-08-30 NOTE — ED Triage Notes (Signed)
Patient reports 5 days of congestion, headaches, sinus pressure and congestion and neck pain. States she passed out at work on Monday of this week. Took ibuprofen at 1800.

## 2017-08-31 NOTE — ED Provider Notes (Signed)
[facility]    CSN: [id number] Arrival date & time: 08/30/17  1846     History   Chief Complaint Chief Complaint  Patient presents with  . Nasal Congestion  . Facial Pain  . Headache  . Neck Pain    HPI [name] is a 42 y.o. female.   The history is provided by the patient. No language interpreter was used.  Headache  Pain location:  Generalized Radiates to:  Does not radiate Severity currently:  Unable to specify Duration:  5 days Progression:  Worsening Chronicity:  New Similar to prior headaches: no   Relieved by:  Nothing Worsened by:  Nothing Ineffective treatments:  None tried Associated symptoms: neck pain   Associated symptoms: no cough   Risk factors: does not have insomnia   Neck Pain  Associated symptoms: headaches    Pt complains of sinus pressure and congestion Past Medical History:  Diagnosis Date  . Anemia   . Cervical spine pain   . Polyp, uterus corpus   . Sarcoidosis of lung (HCC) 2004  . Scoliosis     Patient Active Problem List   Diagnosis Date Noted  . Abnormality of gait   . Coordination impairment   . Leg weakness, bilateral   . Abnormal MRI, thoracic spine 05/14/2015  . Weakness 05/14/2015  . Chronic anemia 05/14/2015  . Pelvic pain in female 02/01/2012  . BLURRED VISION 10/21/2007  . MYALGIA 10/21/2007  . SOB 10/21/2007  . Sarcoidosis 12/20/2006  . 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 BACKACHE NOS 12/20/2006    Past Surgical History:  Procedure Laterality Date  . CHOLECYSTECTOMY  1997  . HERNIA REPAIR  1996   Left Inguinal  . OVARIAN CYST REMOVAL  1995  . OVARIAN CYST REMOVAL    . TUBAL LIGATION      OB History    Gravida Para Term Preterm AB Living   5 4 4   1 3    SAB TAB Ectopic Multiple Live Births   1   0 0         Home Medications    Prior to Admission medications   Medication Sig Start Date End Date Taking? Authorizing Provider  amoxicillin (AMOXIL) 500 MG capsule Take 1 capsule  (500 mg total) by mouth 3 (three) times daily. 08/30/17   Elson Areas, PA-C  ibuprofen (ADVIL,MOTRIN) 200 MG tablet Take 200 mg by mouth every 6 (six) hours as needed for fever or mild pain.    [provider]    Family History Family History  Problem Relation Age of Onset  . Cancer Maternal Uncle        bone cancer, liver cancer  . Hypertension Mother   . Cancer Maternal Grandmother        lung cancer  . Hypertension Maternal Grandmother     Social History Social History   Tobacco Use  . Smoking status: Current Every Day Smoker    Packs/day: 0.00    Years: 0.00    Pack years: 0.00    Types: Cigarettes  . Smokeless tobacco: Never Used  Substance Use Topics  . Alcohol use: Yes    Comment: occasional  . Drug use: No     Allergies   Aspirin; Hydrocodone; and Orange concentrate [flavoring agent]   Review of Systems Review of Systems  Respiratory: Negative for cough.   Musculoskeletal: Positive for neck pain.  Neurological: Positive for headaches.  All other systems reviewed and are negative.  Physical Exam Triage Vital Signs ED Triage Vitals  Enc Vitals Group     BP 08/30/17 1906 105/68     Pulse Rate 08/30/17 1906 91     Resp 08/30/17 1906 16     Temp 08/30/17 1906 98.4 F (36.9 C)     Temp Source 08/30/17 1906 Oral     SpO2 08/30/17 1906 99 %     Weight 08/30/17 1907 134 lb (60.8 kg)     Height 08/30/17 1907 5' 3.5" (1.613 m)     Head Circumference --      Peak Flow --      Pain Score --      Pain Loc --      Pain Edu? --      Excl. in GC? --    No data found.  Updated Vital Signs BP 105/68 (BP Location: Right Arm)   Pulse 91   Temp 98.4 F (36.9 C) (Oral)   Resp 16   Ht 5' 3.5" (1.613 m)   Wt 134 lb (60.8 kg)   LMP 08/14/2017 (Approximate)   SpO2 99%   BMI 23.36 kg/m   Visual Acuity Right Eye Distance:   Left Eye Distance:   Bilateral Distance:    Right Eye Near:   Left Eye Near:    Bilateral Near:     Physical Exam   Constitutional: She is oriented to person, place, and time. She appears well-developed and well-nourished.  HENT:  Head: Normocephalic and atraumatic.  Mouth/Throat: Posterior oropharyngeal erythema present.  Tender maxillary sinuses,  Eyes: Conjunctivae and EOM are normal. Pupils are equal, round, and reactive to light.  Neck: Normal range of motion. Neck supple.  Pulmonary/Chest: Effort normal.  Abdominal: Soft.  Musculoskeletal: Normal range of motion.  Neurological: She is alert and oriented to person, place, and time.  Skin: Skin is warm and dry.  Psychiatric: She has a normal mood and affect.     UC Treatments / Results  Labs (all labs ordered are listed, but only abnormal results are displayed) Labs Reviewed - No data to display  EKG  EKG Interpretation None       Radiology No results found.  Procedures Procedures (including critical care time)  Medications Ordered in UC Medications - No data to display   Initial Impression / Assessment and Plan / UC Course  I have reviewed the triage vital signs and the nursing notes.  Pertinent labs & imaging results that were available during my care of the patient were reviewed by me and considered in my medical decision making (see chart for details).       Final Clinical Impressions(s) / UC Diagnoses   Final diagnoses:  Acute ethmoidal sinusitis, recurrence not specified    ED Discharge Orders        Ordered    amoxicillin (AMOXIL) 500 MG capsule  3 times daily     08/30/17 1921     An After Visit Summary was printed and given to the patient.   Controlled Substance Prescriptions [place] Controlled Substance Registry consulted? Not Applicable   Elson AreasSofia, Geanine Vandekamp K, [place]PA-C 08/31/17 [id number]

## 2020-04-28 ENCOUNTER — Emergency Department (HOSPITAL_COMMUNITY)
Admission: EM | Admit: 2020-04-28 | Discharge: 2020-04-29 | Disposition: A | Discharge status: Home / Self Care | Payer: Self-pay | Attending: Emergency Medicine | Admitting: Emergency Medicine

## 2020-04-28 ENCOUNTER — Other Ambulatory Visit: Payer: Self-pay

## 2020-04-28 ENCOUNTER — Emergency Department (HOSPITAL_COMMUNITY): Payer: Self-pay

## 2020-04-28 ENCOUNTER — Encounter (HOSPITAL_COMMUNITY): Payer: Self-pay

## 2020-04-28 DIAGNOSIS — F17210 Nicotine dependence, cigarettes, uncomplicated: Secondary | ICD-10-CM | POA: Insufficient documentation

## 2020-04-28 DIAGNOSIS — R072 Precordial pain: Secondary | ICD-10-CM | POA: Insufficient documentation

## 2020-04-28 DIAGNOSIS — K219 Gastro-esophageal reflux disease without esophagitis: Secondary | ICD-10-CM | POA: Insufficient documentation

## 2020-04-28 DIAGNOSIS — R1013 Epigastric pain: Secondary | ICD-10-CM | POA: Insufficient documentation

## 2020-04-28 DIAGNOSIS — R079 Chest pain, unspecified: Secondary | ICD-10-CM

## 2020-04-28 LAB — CBC
HCT: 37.7 % (ref 36.0–46.0)
Hemoglobin: 12.2 g/dL (ref 12.0–15.0)
MCH: 30.3 pg (ref 26.0–34.0)
MCHC: 32.4 g/dL (ref 30.0–36.0)
MCV: 93.8 fL (ref 80.0–100.0)
Platelets: 257 10*3/uL (ref 150–400)
RBC: 4.02 MIL/uL (ref 3.87–5.11)
RDW: 13.2 % (ref 11.5–15.5)
WBC: 7.8 10*3/uL (ref 4.0–10.5)
nRBC: 0 % (ref 0.0–0.2)

## 2020-04-28 LAB — TROPONIN I (HIGH SENSITIVITY)
Troponin I (High Sensitivity): <2 ng/L (ref <18)
Troponin I (High Sensitivity): <2 ng/L (ref <18)

## 2020-04-28 LAB — I-STAT BETA HCG BLOOD, ED (MC, WL, AP ONLY): I-stat hCG, quantitative: <5 m[IU]/mL (ref <5)

## 2020-04-28 LAB — BASIC METABOLIC PANEL
Anion gap: 11 (ref 5–15)
BUN: 14 mg/dL (ref 6–20)
CO2: 22 mmol/L (ref 22–32)
Calcium: 9 mg/dL (ref 8.9–10.3)
Chloride: 105 mmol/L (ref 98–111)
Creatinine, Ser: 1.26 mg/dL — ABNORMAL HIGH (ref 0.44–1.00)
GFR, Estimated: 54 mL/min — ABNORMAL LOW (ref >60)
Glucose, Bld: 108 mg/dL — ABNORMAL HIGH (ref 70–99)
Potassium: 4 mmol/L (ref 3.5–5.1)
Sodium: 138 mmol/L (ref 135–145)

## 2020-04-28 MED ORDER — PANTOPRAZOLE SODIUM 40 MG PO TBEC: 40.0000 mg | DELAYED_RELEASE_TABLET | Freq: Every day | ORAL | 0 refills | Status: DC

## 2020-04-28 MED ORDER — ALUM & MAG HYDROXIDE-SIMETH 200-200-20 MG/5ML PO SUSP
30.0000 mL | Freq: Once | ORAL | Status: AC
  Administered 2020-04-28: 30 mL via ORAL
  Filled 2020-04-28: qty 30

## 2020-04-28 MED ORDER — LIDOCAINE VISCOUS HCL 2 % MT SOLN
15.0000 mL | Freq: Once | OROMUCOSAL | Status: AC
  Administered 2020-04-28: 15 mL via ORAL
  Filled 2020-04-28: qty 15

## 2020-04-28 NOTE — Discharge Instructions (Signed)
Please refer to the attached instructions 

## 2020-04-28 NOTE — ED Triage Notes (Signed)
Pt reports upper chest discomfort d/t increase indigestion. Pt taking mylanta, zantac and nexium with some relief. states the pain subsides for a few hours and then returns.

## 2020-04-28 NOTE — ED Provider Notes (Signed)
[facility] EMERGENCY DEPARTMENT Provider Note   CSN: [id number] Arrival date & time: 04/28/20  1820     History Chief Complaint  Patient presents with  . Chest Pain    [name] is a 44 y.o. female.  Patient reports burning and pressure like central and lower mid chest discomfort, initially onset about one week ago. Hx of GERD, sarcoidosis. Has tried mylanta, zantac, and nexium with temporary relief.   The history is provided by the patient. No language interpreter was used.  Chest Pain Pain location:  Epigastric and substernal area Pain quality: burning and pressure   Pain severity:  Moderate Onset quality:  Gradual Duration:  1 week Timing:  Sporadic Progression:  Waxing and waning Chronicity:  New Context: stress   Associated symptoms: heartburn   Associated symptoms: no abdominal pain, no dysphagia, no fever, no nausea and no vomiting        Past Medical History:  Diagnosis Date  . Anemia   . Cervical spine pain   . Polyp, uterus corpus   . Sarcoidosis of lung (HCC) 2004  . Scoliosis     Patient Active Problem List   Diagnosis Date Noted  . Abnormality of gait   . Coordination impairment   . Leg weakness, bilateral   . Abnormal MRI, thoracic spine 05/14/2015  . Weakness 05/14/2015  . Chronic anemia 05/14/2015  . Pelvic pain in female 02/01/2012  . BLURRED VISION 10/21/2007  . MYALGIA 10/21/2007  . SOB 10/21/2007  . Sarcoidosis 12/20/2006  . 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 BACKACHE NOS 12/20/2006    Past Surgical History:  Procedure Laterality Date  . CHOLECYSTECTOMY  1997  . HERNIA REPAIR  1996   Left Inguinal  . OVARIAN CYST REMOVAL  1995  . OVARIAN CYST REMOVAL    . TUBAL LIGATION       OB History    Gravida  5   Para  4   Term  4   Preterm      AB  1   Living  3     SAB  1   TAB      Ectopic  0   Multiple  0   Live Births              Family History  Problem Relation Age of Onset    . Cancer Maternal Uncle        bone cancer, liver cancer  . Hypertension Mother   . Cancer Maternal Grandmother        lung cancer  . Hypertension Maternal Grandmother     Social History   Tobacco Use  . Smoking status: Current Every Day Smoker    Packs/day: 0.00    Years: 0.00    Pack years: 0.00    Types: Cigarettes  . Smokeless tobacco: Never Used  Substance Use Topics  . Alcohol use: Yes    Comment: occasional  . Drug use: No    Home Medications Prior to Admission medications   Medication Sig Start Date End Date Taking? Authorizing Provider  amoxicillin (AMOXIL) 500 MG capsule Take 1 capsule (500 mg total) by mouth 3 (three) times daily. 08/30/17   Elson Areas, PA-C  ibuprofen (ADVIL,MOTRIN) 200 MG tablet Take 200 mg by mouth every 6 (six) hours as needed for fever or mild pain.    [provider]    Allergies    Aspirin, Hydrocodone, and Orange concentrate [flavoring agent]  Review of  Systems   Review of Systems  Constitutional: Negative for fever.  HENT: Negative for trouble swallowing.   Cardiovascular: Positive for chest pain.  Gastrointestinal: Positive for heartburn. Negative for abdominal pain, nausea and vomiting.  All other systems reviewed and are negative.   Physical Exam Updated Vital Signs BP 136/84 (BP Location: Left Arm)   Pulse 62   Temp 98.3 F (36.8 C) (Oral)   Resp 16   Ht 5\' 3"  (1.6 m)   Wt 60.8 kg   SpO2 100%   BMI 23.74 kg/m   Physical Exam Vitals and nursing note reviewed.  Constitutional:      Appearance: She is well-developed.  HENT:     Head: Normocephalic.     Mouth/Throat:     Mouth: Mucous membranes are moist.  Eyes:     Conjunctiva/sclera: Conjunctivae normal.  Cardiovascular:     Rate and Rhythm: Normal rate and regular rhythm.  Pulmonary:     Breath sounds: Normal breath sounds.  Abdominal:     Palpations: Abdomen is soft.  Musculoskeletal:        General: No tenderness.     Cervical back: Neck  supple.     Right lower leg: No edema.     Left lower leg: No edema.  Skin:    General: Skin is warm and dry.  Neurological:     Mental Status: She is alert and oriented to person, place, and time.  Psychiatric:        Mood and Affect: Mood normal.        Behavior: Behavior normal.     ED Results / Procedures / Treatments   Labs (all labs ordered are listed, but only abnormal results are displayed) Labs Reviewed  BASIC METABOLIC PANEL - Abnormal; Notable for the following components:      Result Value   Glucose, Bld 108 (*)    Creatinine, Ser 1.26 (*)    GFR, Estimated 54 (*)    All other components within normal limits  CBC  I-STAT BETA HCG BLOOD, ED ([facility], [facility], [facility] ONLY)  TROPONIN I (HIGH SENSITIVITY)  TROPONIN I (HIGH SENSITIVITY)    EKG None  Radiology DG Chest 2 View  Result Date: 04/28/2020 CLINICAL DATA:  Chest pain EXAM: CHEST - 2 VIEW COMPARISON:  None. FINDINGS: The heart size and mediastinal contours are within normal limits. Both lungs are clear. Thoracic dextroscoliosis. IMPRESSION: No active cardiopulmonary disease. Electronically Signed   By: 04/30/2020 M.D.   On: 04/28/2020 19:52    Procedures Procedures (including critical care time)  Medications Ordered in ED Medications  alum & mag hydroxide-simeth (MAALOX/MYLANTA) 200-200-20 MG/5ML suspension 30 mL (30 mLs Oral Given 04/28/20 2236)    And  lidocaine (XYLOCAINE) 2 % viscous mouth solution 15 mL (15 mLs Oral Given 04/28/20 2236)    ED Course  I have reviewed the triage vital signs and the nursing notes.  Pertinent labs & imaging results that were available during my care of the patient were reviewed by me and considered in my medical decision making (see chart for details).    MDM Rules/Calculators/A&P                          Patient is to be discharged with recommendation to follow up with PCP in regards to today's hospital visit. Chest pain is not likely of cardiac or pulmonary etiology  d/t presentation, perc negative, VSS, no tracheal deviation, no JVD or  new murmur, RRR, breath sounds equal bilaterally, EKG without acute abnormalities, negative troponin, and negative CXR. Pt has been advised start a PPI and return to the ED is CP becomes exertional, associated with diaphoresis or nausea, radiates to left jaw/arm, worsens or becomes concerning in any way. Pt appears reliable for follow up and is agreeable to discharge.   Case has been discussed with Dr. Silverio Lay who agrees with the above plan to discharge.  Final Clinical Impression(s) / ED Diagnoses Final diagnoses:  Nonspecific chest pain    Rx / DC Orders ED Discharge Orders         Ordered    pantoprazole (PROTONIX) 40 MG tablet  Daily        04/28/20 2346           Felicie Morn, NP 04/28/20 2351    Charlynne Pander, MD 05/01/20 [phone]

## 2020-07-02 ENCOUNTER — Emergency Department (HOSPITAL_COMMUNITY)
Admission: EM | Admit: 2020-07-02 | Discharge: 2020-07-02 | Disposition: A | Discharge status: Home / Self Care | Payer: Self-pay

## 2020-07-02 ENCOUNTER — Emergency Department (HOSPITAL_COMMUNITY)
Admission: EM | Admit: 2020-07-02 | Discharge: 2020-07-02 | Disposition: A | Discharge status: Home / Self Care | Payer: HRSA Program | Attending: Emergency Medicine | Admitting: Emergency Medicine

## 2020-07-02 ENCOUNTER — Other Ambulatory Visit: Payer: Self-pay

## 2020-07-02 DIAGNOSIS — F17210 Nicotine dependence, cigarettes, uncomplicated: Secondary | ICD-10-CM | POA: Diagnosis not present

## 2020-07-02 DIAGNOSIS — M79605 Pain in left leg: Secondary | ICD-10-CM | POA: Insufficient documentation

## 2020-07-02 DIAGNOSIS — U071 COVID-19: Secondary | ICD-10-CM | POA: Diagnosis not present

## 2020-07-02 DIAGNOSIS — M79604 Pain in right leg: Secondary | ICD-10-CM | POA: Diagnosis not present

## 2020-07-02 DIAGNOSIS — R509 Fever, unspecified: Secondary | ICD-10-CM | POA: Diagnosis present

## 2020-07-02 LAB — RESP PANEL BY RT-PCR (FLU A&B, COVID) ARPGX2
Influenza A by PCR: NEGATIVE
Influenza B by PCR: NEGATIVE
SARS Coronavirus 2 by RT PCR: POSITIVE — AB

## 2020-07-02 MED ORDER — IBUPROFEN 400 MG PO TABS
400.0000 mg | ORAL_TABLET | Freq: Once | ORAL | Status: AC | PRN
  Administered 2020-07-02: 400 mg via ORAL
  Filled 2020-07-02: qty 1

## 2020-07-02 MED ORDER — ACETAMINOPHEN 325 MG PO TABS
650.0000 mg | ORAL_TABLET | Freq: Once | ORAL | Status: AC
  Administered 2020-07-02: 650 mg via ORAL
  Filled 2020-07-02: qty 2

## 2020-07-02 MED ORDER — IBUPROFEN 400 MG PO TABS
600.0000 mg | ORAL_TABLET | Freq: Once | ORAL | Status: AC
  Administered 2020-07-02: 600 mg via ORAL
  Filled 2020-07-02: qty 1

## 2020-07-02 NOTE — ED Notes (Signed)
Aspirin in pt's allergies but pt reports taking ibuprofen w/ no problems

## 2020-07-02 NOTE — ED Notes (Signed)
Pt SpO2 99-100% while ambulating on room air.

## 2020-07-02 NOTE — ED Notes (Signed)
Pt turned in labels and left 

## 2020-07-02 NOTE — ED Provider Notes (Signed)
[facility]   CSN: [id number] Arrival date & time: 07/02/20  0051     History Chief Complaint  Patient presents with  . Leg Pain    [name] is a 44 y.o. female with pertinent past medical history of sarcoidosis that presents the emergency department today for bilateral leg pain, fever, congestion and headache.  Patient states that she has been having the symptoms since Wednesday, have been staying about the same.  Denies any cough or shortness of breath.  Denies any chest pain.  Myalgias are in her legs only.  Denies any weakness, numbness or tingling.  Denies any dysuria, hematuria, bowel incontinence, bladder incontinence or retention.  Denies any back pain.  Denies any trauma to her legs.  Feels as if her legs are being stabbed, worse when her fever is high.  States that she has been trying Tylenol for this.  Denies any sick contacts.  States that she did have 1 dose of her Covid vaccination, did not complete her second dose.  Denies any chance of pregnancy.  Denies any nausea, vomiting, abdominal pain.  States that she was in her normal health before this.  States that pain does not radiate down her legs is rather just all over her legs.  Denies any difficulty walking.  No other complaints.  Denies any coagulation disorder, history of cancer, long travel, recent surgery, chance pregnancy. HPI     Past Medical History:  Diagnosis Date  . Anemia   . Cervical spine pain   . Polyp, uterus corpus   . Sarcoidosis of lung (HCC) 2004  . Scoliosis     Patient Active Problem List   Diagnosis Date Noted  . Abnormality of gait   . Coordination impairment   . Leg weakness, bilateral   . Abnormal MRI, thoracic spine 05/14/2015  . Weakness 05/14/2015  . Chronic anemia 05/14/2015  . Pelvic pain in female 02/01/2012  . BLURRED VISION 10/21/2007  . MYALGIA 10/21/2007  . SOB 10/21/2007  . Sarcoidosis 12/20/2006  . STEROID  MYOPATHY 12/20/2006  . BACKACHE NOS 12/20/2006    Past Surgical History:  Procedure Laterality Date  . CHOLECYSTECTOMY  1997  . HERNIA REPAIR  1996   Left Inguinal  . OVARIAN CYST REMOVAL  1995  . OVARIAN CYST REMOVAL    . TUBAL LIGATION       OB History    Gravida  5   Para  4   Term  4   Preterm      AB  1   Living  3     SAB  1   IAB      Ectopic  0   Multiple  0   Live Births              Family History  Problem Relation Age of Onset  . Cancer Maternal Uncle        bone cancer, liver cancer  . Hypertension Mother   . Cancer Maternal Grandmother        lung cancer  . Hypertension Maternal Grandmother     Social History   Tobacco Use  . Smoking status: Current Every Day Smoker    Packs/day: 0.00    Years: 0.00    Pack years: 0.00    Types: Cigarettes  . Smokeless tobacco: Never Used  Substance Use Topics  . Alcohol use: Yes    Comment: occasional  . Drug use: No  Home Medications Prior to Admission medications   Medication Sig Start Date End Date Taking? Authorizing Provider  amoxicillin (AMOXIL) 500 MG capsule Take 1 capsule (500 mg total) by mouth 3 (three) times daily. 08/30/17   Elson Areas, PA-C  ibuprofen (ADVIL,MOTRIN) 200 MG tablet Take 200 mg by mouth every 6 (six) hours as needed for fever or mild pain.    [provider]  pantoprazole (PROTONIX) 40 MG tablet Take 1 tablet (40 mg total) by mouth daily. 04/28/20   Felicie Morn, NP    Allergies    Aspirin, Hydrocodone, and Orange concentrate [flavoring agent]  Review of Systems   Review of Systems  Constitutional: Positive for fever. Negative for chills, diaphoresis and fatigue.  HENT: Positive for congestion. Negative for sore throat and trouble swallowing.   Eyes: Negative for pain and visual disturbance.  Respiratory: Negative for cough, shortness of breath and wheezing.   Cardiovascular: Negative for chest pain, palpitations and leg swelling.   Gastrointestinal: Negative for abdominal distention, abdominal pain, diarrhea, nausea and vomiting.  Genitourinary: Negative for difficulty urinating.  Musculoskeletal: Positive for myalgias. Negative for back pain, neck pain and neck stiffness.  Skin: Negative for pallor.  Neurological: Positive for headaches. Negative for dizziness, speech difficulty and weakness.  Psychiatric/Behavioral: Negative for confusion.    Physical Exam Updated Vital Signs BP 108/76 (BP Location: Right Arm)   Pulse 88   Temp (!) 100.6 F (38.1 C) (Oral)   Resp (!) 21   SpO2 98%   Physical Exam Constitutional:      General: She is not in acute distress.    Appearance: Normal appearance. She is not ill-appearing, toxic-appearing or diaphoretic.  HENT:     Mouth/Throat:     Mouth: Mucous membranes are moist.     Pharynx: Oropharynx is clear.  Eyes:     General: No scleral icterus.    Extraocular Movements: Extraocular movements intact.     Pupils: Pupils are equal, round, and reactive to light.  Cardiovascular:     Rate and Rhythm: Normal rate and regular rhythm.     Pulses: Normal pulses.     Heart sounds: Normal heart sounds.  Pulmonary:     Effort: Pulmonary effort is normal. No respiratory distress.     Breath sounds: Normal breath sounds. No stridor. No wheezing, rhonchi or rales.  Chest:     Chest wall: No tenderness.  Abdominal:     General: Abdomen is flat. There is no distension.     Palpations: Abdomen is soft.     Tenderness: There is no abdominal tenderness. There is no guarding or rebound.  Musculoskeletal:        General: No swelling or tenderness. Normal range of motion.     Cervical back: Normal range of motion and neck supple. No rigidity.     Right lower leg: No edema.     Left lower leg: No edema.       Legs:     Comments: No cervical, thoracic or lumbar spine.  No paraspinal muscle tenderness.  Normal range of motion to back.  Patient with tenderness to bilateral legs,  tender to touch.  No erythema or warmth.  No edema.  Normal range of motion to legs with normal leg raise.  Compartments are soft.  PT pulses 2+ bilaterally.  Normal strength and sensation to hip, knee, ankle and toes.  Normal gait.  Skin:    General: Skin is warm and dry.  Capillary Refill: Capillary refill takes less than 2 seconds.     Coloration: Skin is not pale.  Neurological:     General: No focal deficit present.     Mental Status: She is alert and oriented to person, place, and time.     Comments: Alert. Clear speech. No facial droop. CNIII-XII grossly intact. Bilateral upper and lower extremities' sensation grossly intact. 5/5 symmetric strength with grip strength and with plantar and dorsi flexion bilaterally. Patellar DTRs are 2+ and symmetric . Normal finger to nose bilaterally. Negative pronator drift. Negative Romberg sign. Gait is steady and intact    Psychiatric:        Mood and Affect: Mood normal.        Behavior: Behavior normal.     ED Results / Procedures / Treatments   Labs (all labs ordered are listed, but only abnormal results are displayed) Labs Reviewed  RESP PANEL BY RT-PCR (FLU A&B, COVID) ARPGX2 - Abnormal; Notable for the following components:      Result Value   SARS Coronavirus 2 by RT PCR POSITIVE (*)    All other components within normal limits    EKG None  Radiology No results found.  Procedures Procedures (including critical care time)  Medications Ordered in ED Medications  ibuprofen (ADVIL) tablet 400 mg (400 mg Oral Given 07/02/20 0107)  acetaminophen (TYLENOL) tablet 650 mg (650 mg Oral Given 07/02/20 0829)  ibuprofen (ADVIL) tablet 600 mg (600 mg Oral Given 07/02/20 1540)    ED Course  I have reviewed the triage vital signs and the nursing notes.  Pertinent labs & imaging results that were available during my care of the patient were reviewed by me and considered in my medical decision making (see chart for details).    MDM  Rules/Calculators/A&P                         [name] is a 44 y.o. female with pertinent past medical history of sarcoidosis that presents the emergency department today for bilateral leg pain, fever, congestion and headache.  Patient with URI symptoms with myalgias in bilateral lower extremities, Covid test positive.  I think that bilateral leg pain is most likely due to Covid, did explain this to patient.  Patient is distally neurovascularly intact with normal reflex, no concerns for DVT or neurologic pathology or cellulitis or other emergent condition.  Patient symptoms get better with Tylenol.  No red flag symptoms.  Patient does not have any shortness of breath or chest pain.  Normal neuro exam.  Patient is unfortunately been waiting in the waiting room for over 8 hours, when she checked in she had normal pulse with normal respirations and temperature, however as she has been weeding her she has been slightly tachycardic to 100 with increased temperature of 100.9.  Tylenol given, repeat vitals show pulse of 88, temperature down 100.6 Upon ambulation sats remained above 99% on room air. I did message [facility], they will contact her.  Patient to be discharged at this time, strict return precautions given.  Doubt need for further emergent work up at this time. I explained the diagnosis and have given explicit precautions to return to the ER including for any other new or worsening symptoms. The patient understands and accepts the medical plan as it's been dictated and I have answered their questions. Discharge instructions concerning home care and prescriptions have been given. The patient is STABLE and is discharged  to home in good condition.  I discussed this case with my attending physician who cosigned this note including patient's presenting symptoms, physical exam, and planned diagnostics and interventions. Attending physician stated agreement with plan or made changes to plan which  were implemented.    Final Clinical Impression(s) / ED Diagnoses Final diagnoses:  COVID    Rx / DC Orders ED Discharge Orders    None       Farrel Gordon, PA-C 07/02/20 1042    Gwyneth Sprout, MD 07/02/20 1230

## 2020-07-02 NOTE — ED Triage Notes (Signed)
Pt presents to ED POV. Pt c/o bilateral leg pain. Pt states it feels like she is being stabbed in the legs. Pt also c/o fever at home of 100.5 and rhinorrhea.

## 2020-07-02 NOTE — Discharge Instructions (Signed)
Your symptoms today are suggestive of Covid, your Covid test was positive.  I want you to follow-up with your primary care doctor in the next couple of days, he can do this over telehealth.  The infusion clinic should message you within the next 48 hours.  I want you to continue to take Tylenol as directed on the bottle for pain.  If you have any new worsening concerning symptoms such as shortness of breath or difficulty breathing come back here.  In regards to your legs if you have any numbness or tingling, weakness, if you start urinating on yourself or have any problems with your bowels or have trouble walking you need to also come back to the emergency department.

## 2020-08-11 ENCOUNTER — Encounter (HOSPITAL_COMMUNITY): Payer: Self-pay | Admitting: Urgent Care

## 2020-08-11 ENCOUNTER — Other Ambulatory Visit: Payer: Self-pay

## 2020-08-11 ENCOUNTER — Ambulatory Visit (HOSPITAL_COMMUNITY)
Admission: EM | Admit: 2020-08-11 | Discharge: 2020-08-11 | Disposition: A | Discharge status: Home / Self Care | Payer: Self-pay | Attending: Urgent Care | Admitting: Urgent Care

## 2020-08-11 ENCOUNTER — Ambulatory Visit (INDEPENDENT_AMBULATORY_CARE_PROVIDER_SITE_OTHER): Payer: Self-pay

## 2020-08-11 DIAGNOSIS — B349 Viral infection, unspecified: Secondary | ICD-10-CM

## 2020-08-11 DIAGNOSIS — R059 Cough, unspecified: Secondary | ICD-10-CM

## 2020-08-11 DIAGNOSIS — Z862 Personal history of diseases of the blood and blood-forming organs and certain disorders involving the immune mechanism: Secondary | ICD-10-CM

## 2020-08-11 DIAGNOSIS — F17200 Nicotine dependence, unspecified, uncomplicated: Secondary | ICD-10-CM

## 2020-08-11 DIAGNOSIS — R0789 Other chest pain: Secondary | ICD-10-CM

## 2020-08-11 MED ORDER — PREDNISONE 20 MG PO TABS: ORAL_TABLET | ORAL | 0 refills | Status: DC

## 2020-08-11 MED ORDER — ACETAMINOPHEN 325 MG PO TABS: 650.0000 mg | ORAL_TABLET | Freq: Once | ORAL | Status: DC

## 2020-08-11 MED ORDER — BENZONATATE 100 MG PO CAPS
100.0000 mg | ORAL_CAPSULE | Freq: Three times a day (TID) | ORAL | 0 refills | Status: DC | PRN
Start: 2020-08-11 — End: 2023-03-28

## 2020-08-11 MED ORDER — PROMETHAZINE-DM 6.25-15 MG/5ML PO SYRP: 5.0000 mL | ORAL_SOLUTION | Freq: Every evening | ORAL | 0 refills | Status: DC | PRN

## 2020-08-11 NOTE — ED Triage Notes (Signed)
Pt reports Sx's started 2 days ago. Pt reports fever ,cough and generalized body pain. Pt reports she was positive for COVID Jul 02 2020.

## 2020-08-11 NOTE — Discharge Instructions (Signed)
We will manage this as a viral syndrome. For sore throat or cough try using a honey-based tea. Use 3 teaspoons of honey with juice squeezed from half lemon. Place shaved pieces of ginger into 1/2-1 cup of water and warm over stove top. Then mix the ingredients and repeat every 4 hours as needed. Please take Tylenol 500mg-650mg once every 6 hours for fevers, aches and pains. Hydrate very well with at least 2 liters (64 ounces) of water. Eat light meals such as soups (chicken and noodles, chicken wild rice, vegetable).  Do not eat any foods that you are allergic to.  Start an antihistamine like Zyrtec, Allegra or Claritin for postnasal drainage, sinus congestion.   

## 2020-08-11 NOTE — ED Provider Notes (Signed)
[facility]   MRN: [id number] DOB: 1975/07/26  Subjective:   [name] is a 45 y.o. female presenting for 2 day history of acute onset chest tightness, cough, fever (highest was 102.64F), body aches, back aches. Tested positive for COVID 19 on 07/02/2021. Has a history of sarcoidosis, has not had a flare up since 2003. Smokes 1/2ppd. Denies chest pain, history of asthma, COPD, no wheezing, active shob.  No current facility-administered medications for this encounter.  Current Outpatient Medications:  .  ibuprofen (ADVIL,MOTRIN) 200 MG tablet, Take 200 mg by mouth every 6 (six) hours as needed for fever or mild pain., Disp: , Rfl:  .  pantoprazole (PROTONIX) 40 MG tablet, Take 1 tablet (40 mg total) by mouth daily., Disp: 30 tablet, Rfl: 0   Allergies  Allergen Reactions  . Aspirin     REACTION: GI upset  . Hydrocodone     REACTION: Chest pain  . Orange Concentrate [Flavoring Agent] Rash    Past Medical History:  Diagnosis Date  . Anemia   . Cervical spine pain   . Polyp, uterus corpus   . Sarcoidosis of lung (HCC) 2004  . Scoliosis      Past Surgical History:  Procedure Laterality Date  . CHOLECYSTECTOMY  1997  . HERNIA REPAIR  1996   Left Inguinal  . OVARIAN CYST REMOVAL  1995  . OVARIAN CYST REMOVAL    . TUBAL LIGATION      Family History  Problem Relation Age of Onset  . Cancer Maternal Uncle        bone cancer, liver cancer  . Hypertension Mother   . Cancer Maternal Grandmother        lung cancer  . Hypertension Maternal Grandmother     Social History   Tobacco Use  . Smoking status: Current Every Day Smoker    Packs/day: 0.00    Years: 0.00    Pack years: 0.00    Types: Cigarettes  . Smokeless tobacco: Never Used  Substance Use Topics  . Alcohol use: Yes    Comment: occasional  . Drug use: No    ROS   Objective:   Vitals: BP 107/71 (BP Location: Right Arm)   Pulse 85   Temp 100.2 F (37.9 C) (Oral)   Resp  20   LMP 07/11/2020 Comment: tubale ligation  Physical Exam Constitutional:      General: She is not in acute distress.    Appearance: Normal appearance. She is well-developed. She is not ill-appearing, toxic-appearing or diaphoretic.  HENT:     Head: Normocephalic and atraumatic.     Right Ear: External ear normal.     Left Ear: External ear normal.     Nose: Nose normal.     Mouth/Throat:     Mouth: Mucous membranes are moist.  Eyes:     General: No scleral icterus.       Right eye: No discharge.        Left eye: No discharge.     Extraocular Movements: Extraocular movements intact.     Conjunctiva/sclera: Conjunctivae normal.     Pupils: Pupils are equal, round, and reactive to light.  Cardiovascular:     Rate and Rhythm: Normal rate and regular rhythm.     Pulses: Normal pulses.     Heart sounds: Normal heart sounds. No murmur heard. No friction rub. No gallop.   Pulmonary:     Effort: Pulmonary effort is normal. No respiratory  distress.     Breath sounds: Normal breath sounds. No stridor. No wheezing, rhonchi or rales.  Skin:    General: Skin is warm and dry.     Findings: No rash.  Neurological:     Mental Status: She is alert and oriented to person, place, and time.  Psychiatric:        Mood and Affect: Mood normal.        Behavior: Behavior normal.        Thought Content: Thought content normal.        Judgment: Judgment normal.    DG Chest 2 View  Result Date: 08/11/2020 CLINICAL DATA:  Cough and chest tightness EXAM: CHEST - 2 VIEW COMPARISON:  April 28, 2020 FINDINGS: Lungs are clear. Heart size and pulmonary vascularity are normal. No adenopathy. There is thoracic dextroscoliosis. IMPRESSION: Lungs clear.  Cardiac silhouette normal. Electronically Signed   By: Bretta Bang III M.D.   On: 08/11/2020 10:09   Assessment and Plan :   PDMP not reviewed this encounter.  1. Viral illness   2. Cough   3. Chest tightness   4. History of sarcoidosis   5.  Smoker     Deferred COVID 19 test given her results ~1 month ago. Patient would like aggressive management as she does well with steroids. Will use prednisone x5 days. Use supportive care otherwise. Discussed possibility of influenza but we have had such few cases and patient is outside the 48 hour window for Tamiflu. Counseled patient on potential for adverse effects with medications prescribed/recommended today, ER and return-to-clinic precautions discussed, patient verbalized understanding.    Wallis Bamberg, PA-C 08/11/20 1024

## 2021-05-24 ENCOUNTER — Other Ambulatory Visit: Payer: Self-pay

## 2021-05-24 ENCOUNTER — Emergency Department (HOSPITAL_COMMUNITY)
Admission: EM | Admit: 2021-05-24 | Discharge: 2021-05-25 | Disposition: A | Discharge status: Home / Self Care | Payer: Self-pay | Attending: Emergency Medicine | Admitting: Emergency Medicine

## 2021-05-24 DIAGNOSIS — R1031 Right lower quadrant pain: Secondary | ICD-10-CM | POA: Insufficient documentation

## 2021-05-24 DIAGNOSIS — F17210 Nicotine dependence, cigarettes, uncomplicated: Secondary | ICD-10-CM | POA: Insufficient documentation

## 2021-05-24 DIAGNOSIS — R109 Unspecified abdominal pain: Secondary | ICD-10-CM

## 2021-05-25 ENCOUNTER — Other Ambulatory Visit: Payer: Self-pay

## 2021-05-25 ENCOUNTER — Emergency Department (HOSPITAL_COMMUNITY): Payer: Self-pay

## 2021-05-25 LAB — BASIC METABOLIC PANEL
Anion gap: 6 (ref 5–15)
BUN: 18 mg/dL (ref 6–20)
CO2: 23 mmol/L (ref 22–32)
Calcium: 8.7 mg/dL — ABNORMAL LOW (ref 8.9–10.3)
Chloride: 108 mmol/L (ref 98–111)
Creatinine, Ser: 1.26 mg/dL — ABNORMAL HIGH (ref 0.44–1.00)
GFR, Estimated: 54 mL/min — ABNORMAL LOW (ref >60)
Glucose, Bld: 107 mg/dL — ABNORMAL HIGH (ref 70–99)
Potassium: 4.1 mmol/L (ref 3.5–5.1)
Sodium: 137 mmol/L (ref 135–145)

## 2021-05-25 LAB — CBC
HCT: 34.9 % — ABNORMAL LOW (ref 36.0–46.0)
Hemoglobin: 11.6 g/dL — ABNORMAL LOW (ref 12.0–15.0)
MCH: 31.2 pg (ref 26.0–34.0)
MCHC: 33.2 g/dL (ref 30.0–36.0)
MCV: 93.8 fL (ref 80.0–100.0)
Platelets: 259 10*3/uL (ref 150–400)
RBC: 3.72 MIL/uL — ABNORMAL LOW (ref 3.87–5.11)
RDW: 13.2 % (ref 11.5–15.5)
WBC: 6.9 10*3/uL (ref 4.0–10.5)
nRBC: 0 % (ref 0.0–0.2)

## 2021-05-25 LAB — URINALYSIS, ROUTINE W REFLEX MICROSCOPIC
Bilirubin Urine: NEGATIVE
Glucose, UA: NEGATIVE mg/dL
Ketones, ur: NEGATIVE mg/dL
Nitrite: NEGATIVE
Protein, ur: NEGATIVE mg/dL
Specific Gravity, Urine: 1.021 (ref 1.005–1.030)
pH: 6 (ref 5.0–8.0)

## 2021-05-25 LAB — I-STAT BETA HCG BLOOD, ED (MC, WL, AP ONLY): I-stat hCG, quantitative: <5 m[IU]/mL (ref <5)

## 2021-05-25 MED ORDER — SODIUM CHLORIDE 0.9 % IV BOLUS
500.0000 mL | Freq: Once | INTRAVENOUS | Status: AC
  Administered 2021-05-25: 500 mL via INTRAVENOUS

## 2021-05-25 MED ORDER — IBUPROFEN 400 MG PO TABS
400.0000 mg | ORAL_TABLET | Freq: Once | ORAL | Status: AC
  Administered 2021-05-25: 400 mg via ORAL
  Filled 2021-05-25: qty 1

## 2021-05-25 MED ORDER — IOHEXOL 300 MG/ML  SOLN
100.0000 mL | Freq: Once | INTRAMUSCULAR | Status: AC | PRN
  Administered 2021-05-25: 100 mL via INTRAVENOUS

## 2021-05-25 NOTE — ED Triage Notes (Signed)
Pt c/o right side flank and back pain. Pt reports having a recent UTI and she completed her antibiotics.

## 2021-05-25 NOTE — ED Provider Notes (Signed)
[facility] EMERGENCY DEPARTMENT Provider Note   CSN: [id number] Arrival date & time: 05/24/21  2344     History Chief Complaint  Patient presents with   Flank Pain   Back Pain    [name] is a 45 y.o. female.  The history is provided by the patient.  Flank Pain  Back Pain [name] is a 45 y.o. female who presents to the Emergency Department complaining of flank pain.  She presents to the ED for evaluation of right flank pain that started on Sunday.  Today the pain migrated to her RLQ.  Pain is described as stabbing/shooting.  Pain is constant in nature.    A few weeks ago she was treated with bactrim for a UTI(5days), had dysuria at that time.    Has occasional dysuria but not consistently.    No fever. Has chills, nausea.  No vomiting.  No vaginal discharge.  No new sexual partners.    Has a hx/o sarcoid, vertebral artery dissection.  Transient spinal ischemia, currently aspirin.      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   BACKACHE NOS 12/20/2006    Past Surgical History:  Procedure Laterality Date   CHOLECYSTECTOMY  1997   HERNIA REPAIR  1996   Left Inguinal   OVARIAN CYST REMOVAL  1995   OVARIAN CYST REMOVAL     TUBAL LIGATION       OB History     Gravida  5   Para  4   Term  4   Preterm      AB  1   Living  3      SAB  1   IAB      Ectopic  0   Multiple  0   Live Births              Family History   Problem Relation Age of Onset   Cancer Maternal Uncle        bone cancer, liver cancer   Hypertension Mother    Cancer Maternal Grandmother        lung cancer   Hypertension Maternal Grandmother     Social History   Tobacco Use   Smoking status: Every Day    Packs/day: 0.00    Years: 0.00    Pack years: 0.00    Types: Cigarettes   Smokeless tobacco: Never  Substance Use Topics   Alcohol use: Yes    Comment: occasional   Drug use: No    Home Medications Prior to Admission medications   Medication Sig Start Date End Date Taking? Authorizing Provider  benzonatate (TESSALON) 100 MG capsule Take 1-2 capsules (100-200 mg total) by mouth 3 (three) times daily as needed. 08/11/20   Wallis Bamberg, PA-C  ibuprofen (ADVIL,MOTRIN) 200 MG tablet Take 200 mg by mouth every 6 (six) hours as needed for fever or  mild pain.    [provider]  pantoprazole (PROTONIX) 40 MG tablet Take 1 tablet (40 mg total) by mouth daily. 04/28/20   Felicie Morn, NP  predniSONE (DELTASONE) 20 MG tablet Take 2 tablets daily with breakfast. 08/11/20   Wallis Bamberg, PA-C  promethazine-dextromethorphan (PROMETHAZINE-DM) 6.25-15 MG/5ML syrup Take 5 mLs by mouth at bedtime as needed for cough. 08/11/20   Wallis Bamberg, PA-C    Allergies    Aspirin, Hydrocodone, and Orange concentrate [flavoring agent]  Review of Systems   Review of Systems  Genitourinary:  Positive for flank pain.  Musculoskeletal:  Positive for back pain.  All other systems reviewed and are negative.  Physical Exam Updated Vital Signs BP 121/82 (BP Location: Right Arm)   Pulse 70   Temp 98.4 F (36.9 C) (Oral)   Resp 16   Ht 5\' 3"  (1.6 m)   Wt 66.2 kg   SpO2 100%   BMI 25.86 kg/m   Physical Exam Vitals and nursing note reviewed.  Constitutional:      Appearance: She is well-developed.  HENT:     Head: Normocephalic and atraumatic.  Cardiovascular:     Rate and Rhythm: Normal rate and regular rhythm.     Heart sounds: No  murmur heard. Pulmonary:     Effort: Pulmonary effort is normal. No respiratory distress.     Breath sounds: Normal breath sounds.  Abdominal:     Palpations: Abdomen is soft.     Tenderness: There is no guarding or rebound.     Comments: Mild RLQ tenderness  Musculoskeletal:        General: No tenderness.  Skin:    General: Skin is warm and dry.  Neurological:     Mental Status: She is alert and oriented to person, place, and time.  Psychiatric:        Behavior: Behavior normal.    ED Results / Procedures / Treatments   Labs (all labs ordered are listed, but only abnormal results are displayed) Labs Reviewed  BASIC METABOLIC PANEL - Abnormal; Notable for the following components:      Result Value   Glucose, Bld 107 (*)    Creatinine, Ser 1.26 (*)    Calcium 8.7 (*)    GFR, Estimated 54 (*)    All other components within normal limits  CBC - Abnormal; Notable for the following components:   RBC 3.72 (*)    Hemoglobin 11.6 (*)    HCT 34.9 (*)    All other components within normal limits  URINALYSIS, ROUTINE W REFLEX MICROSCOPIC - Abnormal; Notable for the following components:   APPearance CLOUDY (*)    Hgb urine dipstick SMALL (*)    Leukocytes,Ua LARGE (*)    Bacteria, UA RARE (*)    All other components within normal limits  I-STAT BETA HCG BLOOD, ED ([facility], [facility], [facility] ONLY)    EKG None  Radiology CT Abdomen Pelvis W Contrast  Result Date: 05/25/2021 CLINICAL DATA:  Right lower quadrant abdominal pain. EXAM: CT ABDOMEN AND PELVIS WITH CONTRAST TECHNIQUE: Multidetector CT imaging of the abdomen and pelvis was performed using the standard protocol following bolus administration of intravenous contrast. CONTRAST:  05/27/2021 OMNIPAQUE IOHEXOL 300 MG/ML  SOLN COMPARISON:  CT abdomen pelvis dated 09/22/2012. FINDINGS: Lower chest: The visualized lung bases are clear. No intra-abdominal free air.  Trace free fluid within the pelvis. Hepatobiliary: The liver is unremarkable. There  is mild biliary ductal dilatation, post cholecystectomy. No retained calcified stone noted  in the central CBD. Pancreas: Unremarkable. No pancreatic ductal dilatation or surrounding inflammatory changes. Spleen: Normal in size without focal abnormality. Adrenals/Urinary Tract: The adrenal glands are unremarkable. The kidneys, visualized ureters, and urinary bladder are unremarkable. Stomach/Bowel: There is no bowel obstruction or active inflammation. The appendix is normal. Vascular/Lymphatic: The abdominal aorta and IVC unremarkable. No portal venous gas. There is no adenopathy. Reproductive: The uterus is anteverted.  No adnexal masses. Other: Mild diastasis of anterior abdominal wall musculature at the level of the umbilicus. Musculoskeletal: Degenerative changes of the spine. Mild levoscoliosis. T8 butterfly vertebra. No acute osseous pathology. IMPRESSION: No acute intra-abdominal or pelvic pathology. No bowel obstruction. Normal appendix. Electronically Signed   By: Elgie Collard M.D.   On: 05/25/2021 03:20    Procedures Procedures   Medications Ordered in ED Medications  sodium chloride 0.9 % bolus 500 mL (500 mLs Intravenous New Bag/Given 05/25/21 0128)  ibuprofen (ADVIL) tablet 400 mg (400 mg Oral Given 05/25/21 0129)  iohexol (OMNIPAQUE) 300 MG/ML solution 100 mL (100 mLs Intravenous Contrast Given 05/25/21 0300)    ED Course  I have reviewed the triage vital signs and the nursing notes.  Pertinent labs & imaging results that were available during my care of the patient were reviewed by me and considered in my medical decision making (see chart for details).    MDM Rules/Calculators/A&P                          patient here for evaluation of right flank pain that raised her right lower quadrant. She has mild tenderness on examination without peritoneal findings. UA is not consistent with UTI. BMP with stable renal function. CBC with mild anemia, stable when compared to priors. CT  scan was obtained, which is negative for obstructing stone, appendicitis. Discussed with patient unclear source of symptoms. Discussed home care with rest, activity as tolerated, PCP follow-up and return precautions for any progressive or concerning symptoms.  Final Clinical Impression(s) / ED Diagnoses Final diagnoses:  Right flank pain    Rx / DC Orders ED Discharge Orders     None        Tilden Fossa, MD 05/25/21 [phone]

## 2022-04-01 ENCOUNTER — Other Ambulatory Visit: Payer: Self-pay | Admitting: Internal Medicine

## 2022-04-01 DIAGNOSIS — Z1231 Encounter for screening mammogram for malignant neoplasm of breast: Secondary | ICD-10-CM

## 2022-08-13 ENCOUNTER — Emergency Department (HOSPITAL_COMMUNITY)
Admission: EM | Admit: 2022-08-13 | Discharge: 2022-08-13 | Disposition: A | Discharge status: Home / Self Care | Payer: Commercial Managed Care - HMO | Attending: Emergency Medicine | Admitting: Emergency Medicine

## 2022-08-13 ENCOUNTER — Encounter (HOSPITAL_COMMUNITY): Payer: Self-pay | Admitting: Emergency Medicine

## 2022-08-13 ENCOUNTER — Emergency Department (HOSPITAL_COMMUNITY): Payer: Commercial Managed Care - HMO

## 2022-08-13 ENCOUNTER — Other Ambulatory Visit: Payer: Self-pay

## 2022-08-13 DIAGNOSIS — R1310 Dysphagia, unspecified: Secondary | ICD-10-CM | POA: Diagnosis present

## 2022-08-13 MED ORDER — NITROGLYCERIN 0.4 MG SL SUBL: 0.4000 mg | SUBLINGUAL_TABLET | SUBLINGUAL | Status: DC | PRN

## 2022-08-13 MED ORDER — PANTOPRAZOLE SODIUM 40 MG PO TBEC: 40.0000 mg | DELAYED_RELEASE_TABLET | Freq: Every day | ORAL | 0 refills | Status: AC

## 2022-08-13 MED ORDER — ALUM & MAG HYDROXIDE-SIMETH 200-200-20 MG/5ML PO SUSP
30.0000 mL | Freq: Once | ORAL | Status: AC
  Administered 2022-08-13: 30 mL via ORAL
  Filled 2022-08-13: qty 30

## 2022-08-13 NOTE — ED Triage Notes (Signed)
Pt was eating pigs feet and got a bone stuck in her throat.  She can but does not want to talk b/c she feels the bone moving.  Pt was fed bread afterwards and was able to get it down.  She reports this is not the first time this has happened.

## 2022-08-13 NOTE — ED Provider Notes (Signed)
[facility] Provider Note   CSN: [id number] Arrival date & time: 08/13/22  2120     History {Add pertinent medical, surgical, social history, OB history to HPI:1} Chief complaint: Esophageal foreign body  [name] is a 47 y.o. female.  HPI   Patient states she was at home today.  She was eating pigs feet and felt like she got a bone stuck in her throat.  Patient states initially she was having difficulty swallowing but eventually was able to regurgitate.  Patient states she was able to drink fluids and eat some bread since that time.  However she still has some irritation in her throat.  Patient does not have any difficulty handling her secretions.  No difficulty breathing.  No coughing.  Home Medications Prior to Admission medications   Medication Sig Start Date End Date Taking? Authorizing Provider  benzonatate (TESSALON) 100 MG capsule Take 1-2 capsules (100-200 mg total) by mouth 3 (three) times daily as needed. 08/11/20   Jaynee Eagles, PA-C  ibuprofen (ADVIL,MOTRIN) 200 MG tablet Take 200 mg by mouth every 6 (six) hours as needed for fever or mild pain.    [provider]  pantoprazole (PROTONIX) 40 MG tablet Take 1 tablet (40 mg total) by mouth daily. 08/13/22   Dorie Rank, MD  predniSONE (DELTASONE) 20 MG tablet Take 2 tablets daily with breakfast. 08/11/20   Jaynee Eagles, PA-C  promethazine-dextromethorphan (PROMETHAZINE-DM) 6.25-15 MG/5ML syrup Take 5 mLs by mouth at bedtime as needed for cough. 08/11/20   Jaynee Eagles, PA-C      Allergies    Aspirin, Hydrocodone, and Orange concentrate [flavoring agent]    Review of Systems   Review of Systems  Physical Exam Updated Vital Signs BP (!) 136/97   Pulse 89   Temp 98.9 F (37.2 C) (Oral)   Resp 18   Ht 1.6 m (5' 3"$ )   Wt 66.2 kg   SpO2 96%   BMI 25.85 kg/m  Physical Exam Vitals and nursing note reviewed.  Constitutional:      General: She is not in acute  distress.    Appearance: She is well-developed.  HENT:     Head: Normocephalic and atraumatic.     Right Ear: External ear normal.     Left Ear: External ear normal.     Mouth/Throat:     Mouth: Mucous membranes are moist.     Pharynx: No oropharyngeal exudate or posterior oropharyngeal erythema.  Eyes:     General: No scleral icterus.       Right eye: No discharge.        Left eye: No discharge.     Conjunctiva/sclera: Conjunctivae normal.  Neck:     Trachea: No tracheal deviation.  Cardiovascular:     Rate and Rhythm: Normal rate.  Pulmonary:     Effort: Pulmonary effort is normal. No respiratory distress.     Breath sounds: No stridor.  Abdominal:     General: There is no distension.  Musculoskeletal:        General: No swelling or deformity.     Cervical back: Neck supple.  Skin:    General: Skin is warm and dry.     Findings: No rash.  Neurological:     Mental Status: She is alert. Mental status is at baseline.     Cranial Nerves: No dysarthria or facial asymmetry.     Motor: No seizure activity.     ED Results /  Procedures / Treatments   Labs (all labs ordered are listed, but only abnormal results are displayed) Labs Reviewed - No data to display  EKG None  Radiology DG Neck Soft Tissue  Result Date: 08/13/2022 CLINICAL DATA:  Foreign body in throat EXAM: NECK SOFT TISSUES - 1+ VIEW COMPARISON:  None Available. FINDINGS: There is no evidence of retropharyngeal soft tissue swelling or epiglottic enlargement. The cervical airway is unremarkable and no radio-opaque foreign body identified. IMPRESSION: Negative. Electronically Signed   By: Ronney Asters M.D.   On: 08/13/2022 22:19    Procedures Procedures  {Document cardiac monitor, telemetry assessment procedure when appropriate:1}  Medications Ordered in ED Medications  nitroGLYCERIN (NITROSTAT) SL tablet 0.4 mg (has no administration in time range)  alum & mag hydroxide-simeth (MAALOX/MYLANTA) 200-200-20  MG/5ML suspension 30 mL (30 mLs Oral Given 08/13/22 2303)    ED Course/ Medical Decision Making/ A&P Clinical Course as of 08/13/22 2326  Sun Aug 13, 2022  2317 X-ray knee without signs of any retained foreign body [JK]    Clinical Course User Index [JK] Dorie Rank, MD   {   Click here for ABCD2, HEART and other calculatorsREFRESH Note before signing :1}                          Medical Decision Making Risk OTC drugs. Prescription drug management.   Patient presented with complaints of possible foreign body.  Patient does not have any difficulty handling her secretions.  She is able to drink fluids and there is no evidence of persistent esophageal obstruction.  X-ray results are reassuring.  Patient not having any respiratory difficulty or hoarseness to suggest airway involvement.  Suspect patient is having some irritation from her foreign body earlier.      {Document critical care time when appropriate:1} {Document review of labs and clinical decision tools ie heart score, Chads2Vasc2 etc:1}  {Document your independent review of radiology images, and any outside records:1} {Document your discussion with family members, caretakers, and with consultants:1} {Document social determinants of health affecting pt's care:1} {Document your decision making why or why not admission, treatments were needed:1} Final Clinical Impression(s) / ED Diagnoses Final diagnoses:  Dysphagia, unspecified type    Rx / DC Orders ED Discharge Orders          Ordered    pantoprazole (PROTONIX) 40 MG tablet  Daily        08/13/22 2302

## 2022-08-13 NOTE — Discharge Instructions (Signed)
Take the medications as prescribed to help with the acid irritation.  Follow-up with the GI doctor for further evaluation.

## 2022-08-15 ENCOUNTER — Ambulatory Visit (INDEPENDENT_AMBULATORY_CARE_PROVIDER_SITE_OTHER): Payer: Commercial Managed Care - HMO

## 2022-08-15 ENCOUNTER — Ambulatory Visit (HOSPITAL_COMMUNITY)
Admission: EM | Admit: 2022-08-15 | Discharge: 2022-08-15 | Disposition: A | Discharge status: Home / Self Care | Payer: Commercial Managed Care - HMO | Attending: Emergency Medicine | Admitting: Emergency Medicine

## 2022-08-15 ENCOUNTER — Encounter (HOSPITAL_COMMUNITY): Payer: Self-pay | Admitting: *Deleted

## 2022-08-15 DIAGNOSIS — F17200 Nicotine dependence, unspecified, uncomplicated: Secondary | ICD-10-CM

## 2022-08-15 DIAGNOSIS — Z03821 Encounter for observation for suspected ingested foreign body ruled out: Secondary | ICD-10-CM

## 2022-08-15 DIAGNOSIS — R051 Acute cough: Secondary | ICD-10-CM

## 2022-08-15 DIAGNOSIS — R0789 Other chest pain: Secondary | ICD-10-CM

## 2022-08-15 NOTE — Discharge Instructions (Addendum)
Your CXR was  negative for foreign body aspiration,pneumonia or acute findings. Rest,push fluids. Stop smoking. Follow up with PCP.

## 2022-08-15 NOTE — ED Provider Notes (Signed)
[place]    CSN: [id number] Arrival date & time: 08/15/22  1702      History   Chief Complaint Chief Complaint  Patient presents with   Swallowed Foreign Body    HPI [name] is a 47 y.o. female.   47 year old female, [name], presents to urgent care with chief complaint of concern for foreign body(bone aspiration). Was seen in ER 2/11/ 2024 and had negative soft tissue neck xray. +cough since.   The history is provided by the patient. No language interpreter was used.    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 Authorizing Provider  benzonatate (TESSALON) 100 MG capsule Take 1-2 capsules (100-200 mg total) by mouth 3 (three) times daily as needed. 08/11/20   Jaynee Eagles, PA-C  ibuprofen (ADVIL,MOTRIN) 200 MG tablet Take 200 mg by mouth every 6 (six) hours as needed for fever or mild pain.    [provider]  pantoprazole (PROTONIX) 40 MG tablet Take 1 tablet (40 mg total) by mouth daily. 08/13/22   Dorie Rank, MD  predniSONE (DELTASONE) 20 MG tablet Take 2 tablets daily with breakfast. 08/11/20   Jaynee Eagles, PA-C  promethazine-dextromethorphan (PROMETHAZINE-DM) 6.25-15 MG/5ML syrup Take 5 mLs by mouth at bedtime as needed for cough. 08/11/20   Jaynee Eagles, PA-C    Family History Family History  Problem Relation Age of Onset   Hypertension Mother    Cancer Maternal Grandmother        lung cancer   Hypertension Maternal Grandmother    Cancer Maternal Uncle        bone cancer, liver cancer    Social History Social History   Tobacco Use   Smoking status: Every Day    Packs/day: 0.00    Years: 0.00    Total pack years: 0.00    Types: Cigarettes   Smokeless tobacco: Never  Substance Use Topics   Alcohol use: Yes    Comment: occasional   Drug use:  No     Allergies   Aspirin, Hydrocodone, and Orange concentrate [flavoring agent]   Review of Systems Review of Systems  Constitutional:  Negative for fever.  Respiratory:  Positive for cough. Negative for wheezing and stridor.        Right sided chest wall pain  All other systems reviewed and are negative.    Physical Exam Triage Vital Signs ED Triage Vitals  Enc Vitals Group     BP 08/15/22 1828 127/84     Pulse Rate 08/15/22 1828 80     Resp 08/15/22 1828 20     Temp 08/15/22 1828 98.2 F (36.8 C)     Temp src --      SpO2 08/15/22 1828 98 %     Weight --      Height --      Head Circumference --      Peak Flow --      Pain Score 08/15/22 1825 4     Pain Loc --      Pain Edu? --      Excl. in [place]? --    No data found.  Updated Vital Signs BP 127/84   Pulse 80   Temp 98.2 F (36.8 C)   Resp 20   LMP 08/07/2022   SpO2 98%   Visual Acuity Right Eye Distance:   Left Eye Distance:   Bilateral Distance:    Right Eye Near:   Left Eye Near:    Bilateral Near:     Physical Exam Vitals and nursing note  reviewed.  Constitutional:      General: She is not in acute distress.    Appearance: She is well-developed and well-groomed.  HENT:     Head: Normocephalic and atraumatic.     Comments: Airway patent,no stridor Eyes:     Conjunctiva/sclera: Conjunctivae normal.  Cardiovascular:     Rate and Rhythm: Normal rate and regular rhythm.     Pulses: Normal pulses.     Heart sounds: Normal heart sounds. No murmur heard. Pulmonary:     Effort: Pulmonary effort is normal. No respiratory distress.     Breath sounds: Normal breath sounds and air entry.  Abdominal:     Palpations: Abdomen is soft.     Tenderness: There is no abdominal tenderness.  Musculoskeletal:        General: No swelling.     Cervical back: Neck supple.  Skin:    General: Skin is warm and dry.     Capillary Refill: Capillary refill takes less than 2 seconds.  Neurological:     General: No focal deficit present.     Mental Status: She is alert and oriented to person, place, and time.     GCS: GCS eye subscore is 4. GCS verbal subscore is 5. GCS motor subscore is 6.  Psychiatric:        Attention and Perception: Attention normal.        Mood and Affect: Mood normal.        Speech: Speech normal.        Behavior: Behavior normal. Behavior is cooperative.      UC Treatments / Results  Labs (all labs ordered are listed, but only abnormal results are displayed) Labs Reviewed - No data to display  EKG   Radiology DG Chest 2 View  Result Date: 08/15/2022 CLINICAL DATA:  Swallowed bone with concern for aspiration. Right-sided pain, cough, and chest wall pain. EXAM: CHEST - 2 VIEW  COMPARISON:  08/11/2020 FINDINGS: Heart size and pulmonary vascularity are normal. Lungs are clear. No pleural effusions. No pneumothorax. Mediastinal contours appear intact. Thoracolumbar scoliosis convex towards the right. No radiopaque foreign bodies are demonstrated. Surgical clips in the right upper quadrant. IMPRESSION: No evidence of  active pulmonary disease. No radiopaque foreign bodies identified. Electronically Signed   By: Lucienne Capers M.D.   On: 08/15/2022 19:00   DG Neck Soft Tissue  Result Date: 08/13/2022 CLINICAL DATA:  Foreign body in throat EXAM: NECK SOFT TISSUES - 1+ VIEW COMPARISON:  None Available. FINDINGS: There is no evidence of retropharyngeal soft tissue swelling or epiglottic enlargement. The cervical airway is unremarkable and no radio-opaque foreign body identified. IMPRESSION: Negative. Electronically Signed   By: Ronney Asters M.D.   On: 08/13/2022 22:19    Procedures Procedures (including critical care time)  Medications Ordered in UC Medications - No data to display  Initial Impression / Assessment and Plan / UC Course  I have reviewed the triage vital signs and the nursing notes.  Pertinent labs & imaging results that were available during my care of the patient were reviewed by me and considered in my medical decision making (see chart for details).     Ddx: Aspiration, Foreign body sensation,allergies,viral illness Final Clinical Impressions(s) / UC Diagnoses   Final diagnoses:  Acute cough  Smoker     Discharge Instructions      Your CXR was  negative for foreign body aspiration,pneumonia or acute findings. Rest,push fluids. Stop smoking. Follow up with PCP.      ED Prescriptions   None    PDMP not reviewed this encounter.   Tori Milks, NP [id number]

## 2022-08-15 NOTE — ED Triage Notes (Signed)
Pt has irritation in throat from 08-13-22 when she swallowed a bone. . Pt now has rt sided pain ,cough and chest wall pain. PT has concerns of possible aspiration and PNA. Pt wants a chest x-ray.

## 2022-12-12 ENCOUNTER — Ambulatory Visit (HOSPITAL_COMMUNITY)
Admission: EM | Admit: 2022-12-12 | Discharge: 2022-12-12 | Disposition: A | Discharge status: Home / Self Care | Payer: Commercial Managed Care - HMO

## 2022-12-12 ENCOUNTER — Encounter (HOSPITAL_COMMUNITY): Payer: Self-pay | Admitting: *Deleted

## 2022-12-12 DIAGNOSIS — M79671 Pain in right foot: Secondary | ICD-10-CM | POA: Diagnosis not present

## 2022-12-12 NOTE — ED Provider Notes (Signed)
[facility]    CSN: [id number] Arrival date & time: 12/12/22  1543      History   Chief Complaint Chief Complaint  Patient presents with   Foot Pain    HPI [name] is a 47 y.o. female.  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   Scoliosis     Patient Active Problem List   Diagnosis Date Noted   Acute cough 08/15/2022   Smoker 08/15/2022   Abnormality of gait    Coordination impairment    Leg weakness, bilateral    Abnormal MRI, thoracic spine 05/14/2015   Weakness 05/14/2015   Chronic anemia 05/14/2015   Pelvic pain in female 02/01/2012   BLURRED VISION 10/21/2007   MYALGIA 10/21/2007   SOB 10/21/2007   Sarcoidosis 12/20/2006   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 Authorizing Provider  benzonatate (TESSALON) 100 MG capsule Take 1-2 capsules (100-200 mg total) by mouth 3 (three) times daily as needed. 08/11/20   Wallis Bamberg, PA-C  ibuprofen (ADVIL,MOTRIN) 200 MG tablet Take 200 mg by mouth every 6 (six) hours as needed for fever or mild pain.    [provider]  pantoprazole (PROTONIX) 40  MG tablet Take 1 tablet (40 mg total) by mouth daily. 08/13/22   Linwood Dibbles, MD  predniSONE (DELTASONE) 20 MG tablet Take 2 tablets daily with breakfast. 08/11/20   Wallis Bamberg, PA-C  promethazine-dextromethorphan (PROMETHAZINE-DM) 6.25-15 MG/5ML syrup Take 5 mLs by mouth at bedtime as needed for cough. 08/11/20   Wallis Bamberg, PA-C    Family History Family History  Problem Relation Age of Onset   Hypertension Mother    Cancer Maternal Grandmother        lung cancer   Hypertension Maternal Grandmother    Cancer Maternal Uncle        bone cancer, liver cancer    Social History Social History   Tobacco Use   Smoking status: Every Day    Packs/day: 0.00    Years: 0.00    Additional pack years: 0.00    Total pack years: 0.00    Types: Cigarettes   Smokeless tobacco: Never  Substance Use Topics   Alcohol use: Yes  Comment: occasional   Drug use: No     Allergies   Aspirin, Hydrocodone, and Orange concentrate [flavoring agent]   Review of Systems Review of Systems Per HPI  Physical Exam Triage Vital Signs ED Triage Vitals [12/12/22 1604]  Enc Vitals Group     BP      Pulse      Resp      Temp      Temp src      SpO2      Weight      Height      Head Circumference      Peak Flow      Pain Score 3     Pain Loc      Pain Edu?      Excl. in [place]?    No data found.  Updated Vital Signs BP 136/87 (BP Location: Left Arm)   Pulse 75   Temp 98.5 F (36.9 C) (Oral)   Resp 18   LMP  (LMP Unknown)   SpO2 98%    Physical Exam Vitals and nursing note reviewed.  Constitutional:      General: She is not in acute distress. HENT:     Mouth/Throat:     Pharynx: Oropharynx is clear.  Cardiovascular:     Rate and Rhythm: Normal rate and regular rhythm.     Pulses: Normal pulses.  Pulmonary:     Effort: Pulmonary effort is normal.  Musculoskeletal:        General: Tenderness present. Normal range of motion.     Cervical back: Normal range of motion.     Comments:  Minimally tender over dorsal right foot. Small area of superficial swelling. There is no bony tenderness. Full ROM at ankle. Distal sensation intact. Strong DP pulse. Cap refill < 2 seconds   Skin:    Capillary Refill: Capillary refill takes less than 2 seconds.     Comments: No erythema or rash   Neurological:     Mental Status: She is alert and oriented to person, place, and time.     UC Treatments / Results  Labs (all labs ordered are listed, but only abnormal results are displayed) Labs Reviewed - No data to display  EKG  Radiology No results found.  Procedures Procedures (including critical care time)  Medications Ordered in UC Medications - No data to display  Initial Impression / Assessment and Plan / UC Course  I have reviewed the triage vital signs and the nursing notes.  Pertinent labs & imaging results that were available during my care of the patient were reviewed by me and considered in my medical decision making (see chart for details).  Reassurance provided to patient. Not likely blood clot. No swelling of calves or ankles. No erythema or rash. No injury but considered xray, shared decision making will defer today.  Most likely soft tissue etiology. Recommend attempting symptomatic care with elevating, ice, Tylenol.  Will return or follow-up with Ortho if needed  Final Clinical Impressions(s) / UC Diagnoses   Final diagnoses:  Right foot pain     Discharge Instructions      Apply ice for 10-15 minutes at a time, several times daily Elevate the leg and foot Wear supportive shoes  I recommend to take tylenol and/or ibuprofen for pain and swelling  Please follow with orthopedics if still having pain!      ED Prescriptions   None    PDMP not reviewed this encounter.   Ercilia Bettinger,  Lurena Joiner, PA-C 12/12/22 1634

## 2022-12-12 NOTE — Discharge Instructions (Addendum)
Apply ice for 10-15 minutes at a time, several times daily Elevate the leg and foot Wear supportive shoes  I recommend to take tylenol and/or ibuprofen for pain and swelling  Please follow with orthopedics if still having pain!

## 2022-12-12 NOTE — ED Triage Notes (Signed)
Pt states she has right foot pain and swollen x 1 week. She states hot to touch and throbbing. She doesn't recall any injury

## 2023-03-28 ENCOUNTER — Ambulatory Visit (HOSPITAL_COMMUNITY)
Admission: EM | Admit: 2023-03-28 | Discharge: 2023-03-28 | Disposition: A | Discharge status: Home / Self Care | Payer: Managed Care, Other (non HMO) | Attending: Internal Medicine | Admitting: Internal Medicine

## 2023-03-28 ENCOUNTER — Encounter (HOSPITAL_COMMUNITY): Payer: Self-pay | Admitting: *Deleted

## 2023-03-28 ENCOUNTER — Other Ambulatory Visit: Payer: Self-pay

## 2023-03-28 DIAGNOSIS — Z1152 Encounter for screening for COVID-19: Secondary | ICD-10-CM | POA: Insufficient documentation

## 2023-03-28 DIAGNOSIS — J069 Acute upper respiratory infection, unspecified: Secondary | ICD-10-CM | POA: Diagnosis not present

## 2023-03-28 DIAGNOSIS — Z20822 Contact with and (suspected) exposure to covid-19: Secondary | ICD-10-CM | POA: Diagnosis present

## 2023-03-28 DIAGNOSIS — J029 Acute pharyngitis, unspecified: Secondary | ICD-10-CM | POA: Diagnosis not present

## 2023-03-28 DIAGNOSIS — R051 Acute cough: Secondary | ICD-10-CM | POA: Diagnosis not present

## 2023-03-28 LAB — POCT RAPID STREP A (OFFICE): Rapid Strep A Screen: NEGATIVE

## 2023-03-28 LAB — SARS CORONAVIRUS 2 (TAT 6-24 HRS): SARS Coronavirus 2: NEGATIVE

## 2023-03-28 MED ORDER — IBUPROFEN 400 MG PO TABS: 400.0000 mg | ORAL_TABLET | Freq: Three times a day (TID) | ORAL | 0 refills | Status: AC | PRN

## 2023-03-28 MED ORDER — PROMETHAZINE-DM 6.25-15 MG/5ML PO SYRP: 5.0000 mL | ORAL_SOLUTION | Freq: Two times a day (BID) | ORAL | 0 refills | Status: AC | PRN

## 2023-03-28 MED ORDER — LIDOCAINE VISCOUS HCL 2 % MT SOLN: 15.0000 mL | Freq: Four times a day (QID) | OROMUCOSAL | 0 refills | Status: AC | PRN

## 2023-03-28 NOTE — ED Triage Notes (Signed)
Pt reports since yesterday she has had congestion,sore throat and one hr ago she lost her voice.

## 2023-03-28 NOTE — Discharge Instructions (Signed)
You tested negative for strep.  We will contact you if your culture result comes back positive and we need to start antibiotics.  Monitor your MyChart for your COVID results.  We will contact you if this is positive.  Use ibuprofen 400 mg every 8 hours as needed.  Do not take additional NSAIDs with this medication including aspirin, ibuprofen/Advil, naproxen/Aleve.  Gargle with viscous lidocaine.  Do not eat or drink immediately after using this as it increases your risk of choking.  Take Promethazine DM for cough.  This will make you sleepy so do not drive or drink alcohol with taking it.  Make sure that you rest and drink plenty of fluid.  If your symptoms are not improving within a week return for reevaluation.  If anything worsens you need to be seen immediately including worsening cough, chest pain, shortness of breath, high fever, nausea/vomiting.

## 2023-03-28 NOTE — ED Provider Notes (Signed)
[facility]    CSN: [id number] Arrival date & time: 03/28/23  0102      History   Chief Complaint Chief Complaint  Patient presents with   Hoarse   Sore Throat   Nasal Congestion    HPI [name] is a 47 y.o. female.   Patient presents today with a 24-hour history of URI symptoms.  Reports severe sore throat, congestion, cough, hoarseness.  She reports odynophagia that is impacting her oral intake with pain rated 10 on a 0-10 pain scale.  She has taken vitamin C and AirBrone without improvement of symptoms.  She does report exposure to COVID at her place of employment.  She has had COVID in the past with last episode more than 90 days ago.  She has not had COVID-19 vaccines as she had a reaction to the initial shot.  She has a history of sarcoidosis of the lung as well as current smoking.  Denies formal diagnosis of asthma, COPD, diabetes, chronic liver/kidney disease, cardiovascular disease.  Denies any recent antibiotics or steroids.  Patient utilized the note pad on her phone to answer the majority of questions given severity of hoarseness.  She has no concern for pregnancy.    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 Authorizing Provider  ibuprofen (ADVIL) 400 MG tablet Take 1 tablet (400 mg total) by mouth every 8 (eight) hours as needed. 03/28/23  Yes Kailia Starry K, PA-C  lidocaine (XYLOCAINE) 2 % solution Use as directed 15 mLs in the mouth or throat every 6 (six) hours as needed for mouth pain. 03/28/23  Yes Kasandra Fehr K, PA-C  pantoprazole (PROTONIX) 40 MG tablet Take 1 tablet (40 mg total) by mouth daily. 08/13/22  Yes Linwood Dibbles, MD  promethazine-dextromethorphan (PROMETHAZINE-DM) 6.25-15 MG/5ML syrup Take 5 mLs by mouth 2 (two) times daily as needed for cough. 03/28/23   Taavi Hoose, Noberto Retort, PA-C    Family History Family History  Problem Relation Age of Onset  Hypertension Mother    Cancer Maternal Grandmother        lung cancer   Hypertension Maternal Grandmother    Cancer Maternal Uncle        bone cancer, liver cancer    Social History Social History   Tobacco Use   Smoking status: Every Day    Current packs/day: 0.00    Types: Cigarettes   Smokeless tobacco: Never  Substance Use Topics   Alcohol use: Yes    Comment: occasional   Drug use: No     Allergies   Aspirin, Hydrocodone, and Orange concentrate [flavoring agent]   Review of Systems Review of Systems  Constitutional:  Positive for activity change. Negative for appetite change, fatigue and fever.  HENT:  Positive for congestion, postnasal drip and sore throat. Negative for sinus pressure and sneezing.   Respiratory:  Positive for cough. Negative for shortness of breath.   Cardiovascular:  Negative for chest pain.  Gastrointestinal:  Negative for abdominal pain, diarrhea, nausea and vomiting.     Physical Exam Triage Vital  Signs ED Triage Vitals  Encounter Vitals Group     BP 03/28/23 0906 (!) 153/103     Systolic BP Percentile --      Diastolic BP Percentile --      Pulse Rate 03/28/23 0906 79     Resp 03/28/23 0906 20     Temp 03/28/23 0906 98.9 F (37.2 C)     Temp src --      SpO2 03/28/23 0906 97 %     Weight --      Height --      Head Circumference --      Peak Flow --      Pain Score 03/28/23 0903 10     Pain Loc --      Pain Education --      Exclude from Growth Chart --    No data found.  Updated Vital Signs BP (!) 153/103   Pulse 79   Temp 98.9 F (37.2 C)   Resp 20   LMP 02/08/2023 (Approximate)   SpO2 97%   Visual Acuity Right Eye Distance:   Left Eye Distance:   Bilateral Distance:    Right Eye Near:   Left Eye Near:    Bilateral Near:     Physical Exam Vitals reviewed.  Constitutional:      General: She is awake. She is not in acute distress.    Appearance: Normal appearance. She is well-developed. She is not ill-appearing.     Comments: Very pleasant female appears stated age in no acute distress sitting comfortably in exam room  HENT:     Head: Normocephalic and atraumatic.     Right Ear: Tympanic membrane, ear canal and external ear normal. Tympanic membrane is not erythematous or bulging.     Left Ear: Tympanic membrane, ear canal and external ear normal. Tympanic membrane is not erythematous or bulging.     Nose:     Right Sinus: No maxillary sinus tenderness or frontal sinus tenderness.     Left Sinus: No maxillary sinus tenderness or frontal sinus tenderness.     Mouth/Throat:     Pharynx: Uvula midline. Posterior oropharyngeal erythema and postnasal drip present. No oropharyngeal exudate.  Cardiovascular:     Rate and Rhythm: Normal rate and regular rhythm.     Heart sounds: Normal heart sounds, S1 normal and S2 normal. No murmur heard. Pulmonary:     Effort: Pulmonary  effort is normal.     Breath sounds: Normal breath sounds. No wheezing, rhonchi or  rales.     Comments: Clear to auscultation bilaterally Psychiatric:        Behavior: Behavior is cooperative.      UC Treatments / Results  Labs (all labs ordered are listed, but only abnormal results are displayed) Labs Reviewed  CULTURE, GROUP A STREP ([facility])  SARS CORONAVIRUS 2 (TAT 6-24 HRS)  POCT RAPID STREP A (OFFICE)    EKG   Radiology No results found.  Procedures Procedures (including critical care time)  Medications Ordered in UC Medications - No data to display  Initial Impression / Assessment and Plan / UC Course  I have reviewed the triage vital signs and the nursing notes.  Pertinent labs & imaging results that were available during my care of the patient were reviewed by me and considered in my medical decision making (see chart for details).     Patient is well-appearing, afebrile, nontoxic, nontachycardic.  No evidence of acute infection on physical exam that warrant initiation of antibiotics.  Strep testing was negative.  Will send this for culture but defer antibiotics until culture results are available.  Suspect viral etiology.  Will test for COVID given her known exposure.  She is a candidate for antiviral therapy given her history of sarcoidosis and smoking.  She has not had a recent metabolic panel so would recommend renal dosing of Paxlovid.  She does not need to hold any medications.  Will treat symptomatically with Promethazine DM.  We discussed that this can be sedating and she is not to drive or drink alcohol with taking it.  She can take ibuprofen for pain relief which she has safely taken in the past.  Discussed that she is not to combine this with other NSAIDs due to concern for GI bleeding.  She can use over-the-counter medications including Mucinex, Flonase, Tylenol.  She was given viscous lidocaine to help with her sore throat.  Discussed she is not to eat or drink immediately after using this medication to prevent choking.  If her symptoms or not  improving within a week she is to return for reevaluation.  If she has any worsening symptoms she needs to be seen immediately.  Strict return precautions given.  Work excuse note provided.  Final Clinical Impressions(s) / UC Diagnoses   Final diagnoses:  Upper respiratory tract infection, unspecified type  Sore throat  Acute cough     Discharge Instructions      You tested negative for strep.  We will contact you if your culture result comes back positive and we need to start antibiotics.  Monitor your MyChart for your COVID results.  We will contact you if this is positive.  Use ibuprofen 400 mg every 8 hours as needed.  Do not take additional NSAIDs with this medication including aspirin, ibuprofen/Advil, naproxen/Aleve.  Gargle with viscous lidocaine.  Do not eat or drink immediately after using this as it increases your risk of choking.  Take Promethazine DM for cough.  This will make you sleepy so do not drive or drink alcohol with taking it.  Make sure that you rest and drink plenty of fluid.  If your symptoms are not improving within a week return for reevaluation.  If anything worsens you need to be seen immediately including worsening cough, chest pain, shortness of breath, high fever, nausea/vomiting.     ED Prescriptions     Medication Sig Dispense Auth. Provider  promethazine-dextromethorphan (PROMETHAZINE-DM) 6.25-15 MG/5ML syrup Take 5 mLs by mouth 2 (two) times daily as needed for cough. 100 mL Beren Yniguez K, PA-C   ibuprofen (ADVIL) 400 MG tablet Take 1 tablet (400 mg total) by mouth every 8 (eight) hours as needed. 15 tablet Gerado Nabers K, PA-C   lidocaine (XYLOCAINE) 2 % solution Use as directed 15 mLs in the mouth or throat every 6 (six) hours as needed for mouth pain. 100 mL Nisaiah Bechtol K, PA-C      PDMP not reviewed this encounter.   Jeani Hawking, PA-C 03/28/23 1478

## 2023-03-29 LAB — CULTURE, GROUP A STREP (THRC)

## 2023-03-31 LAB — CULTURE, GROUP A STREP (THRC)

## 2024-04-13 ENCOUNTER — Other Ambulatory Visit: Payer: Self-pay

## 2024-04-13 ENCOUNTER — Emergency Department (HOSPITAL_BASED_OUTPATIENT_CLINIC_OR_DEPARTMENT_OTHER)
Admission: EM | Admit: 2024-04-13 | Discharge: 2024-04-13 | Disposition: A | Discharge status: Home / Self Care | Payer: Self-pay

## 2024-04-13 ENCOUNTER — Encounter (HOSPITAL_BASED_OUTPATIENT_CLINIC_OR_DEPARTMENT_OTHER): Payer: Self-pay | Admitting: Emergency Medicine

## 2024-04-13 ENCOUNTER — Emergency Department (HOSPITAL_BASED_OUTPATIENT_CLINIC_OR_DEPARTMENT_OTHER): Payer: Self-pay

## 2024-04-13 DIAGNOSIS — J40 Bronchitis, not specified as acute or chronic: Secondary | ICD-10-CM | POA: Insufficient documentation

## 2024-04-13 LAB — BASIC METABOLIC PANEL WITH GFR
Anion gap: 12 (ref 5–15)
BUN: 11 mg/dL (ref 6–20)
CO2: 21 mmol/L — ABNORMAL LOW (ref 22–32)
Calcium: 9.2 mg/dL (ref 8.9–10.3)
Chloride: 105 mmol/L (ref 98–111)
Creatinine, Ser: 0.96 mg/dL (ref 0.44–1.00)
GFR, Estimated: >60 mL/min (ref >60)
Glucose, Bld: 93 mg/dL (ref 70–99)
Potassium: 4 mmol/L (ref 3.5–5.1)
Sodium: 137 mmol/L (ref 135–145)

## 2024-04-13 LAB — URINALYSIS, ROUTINE W REFLEX MICROSCOPIC
Bacteria, UA: NONE SEEN
Bilirubin Urine: NEGATIVE
Glucose, UA: NEGATIVE mg/dL
Ketones, ur: NEGATIVE mg/dL
Leukocytes,Ua: NEGATIVE
Nitrite: NEGATIVE
Protein, ur: NEGATIVE mg/dL
Specific Gravity, Urine: 1.009 (ref 1.005–1.030)
pH: 7.5 (ref 5.0–8.0)

## 2024-04-13 LAB — CBC
HCT: 34 % — ABNORMAL LOW (ref 36.0–46.0)
Hemoglobin: 11.2 g/dL — ABNORMAL LOW (ref 12.0–15.0)
MCH: 28.1 pg (ref 26.0–34.0)
MCHC: 32.9 g/dL (ref 30.0–36.0)
MCV: 85.2 fL (ref 80.0–100.0)
Platelets: 213 K/uL (ref 150–400)
RBC: 3.99 MIL/uL (ref 3.87–5.11)
RDW: 13.4 % (ref 11.5–15.5)
WBC: 3.9 K/uL — ABNORMAL LOW (ref 4.0–10.5)
nRBC: 0 % (ref 0.0–0.2)

## 2024-04-13 LAB — RESP PANEL BY RT-PCR (RSV, FLU A&B, COVID)  RVPGX2
Influenza A by PCR: NEGATIVE
Influenza B by PCR: NEGATIVE
Resp Syncytial Virus by PCR: NEGATIVE
SARS Coronavirus 2 by RT PCR: NEGATIVE

## 2024-04-13 LAB — TROPONIN T, HIGH SENSITIVITY: Troponin T High Sensitivity: <15 ng/L (ref 0–19)

## 2024-04-13 MED ORDER — IPRATROPIUM-ALBUTEROL 0.5-2.5 (3) MG/3ML IN SOLN
3.0000 mL | Freq: Once | RESPIRATORY_TRACT | Status: AC
  Administered 2024-04-13: 3 mL via RESPIRATORY_TRACT
  Filled 2024-04-13: qty 3

## 2024-04-13 MED ORDER — ALBUTEROL SULFATE HFA 108 (90 BASE) MCG/ACT IN AERS: 2.0000 | INHALATION_SPRAY | RESPIRATORY_TRACT | 0 refills | Status: AC | PRN

## 2024-04-13 MED ORDER — PREDNISONE 50 MG PO TABS: ORAL_TABLET | ORAL | 0 refills | Status: AC

## 2024-04-13 NOTE — ED Provider Notes (Signed)
 [facility] AT [facility] Provider Note   CSN: [id number] Arrival date & time: 04/13/24  1025     Patient presents with: Wheezing   [name] is a 48 y.o. female.   49 year old female with past medical history of sarcoidosis presenting to the emergency department today with cough and shortness of breath.  The patient states this been going now for the past few days.  States that she started herself on an azithromycin regimen a few days ago.  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  She states that she is having wheezing and a wet cough at night.  This is mostly nonproductive.  She denies a history of DVT or pulmonary embolism, recent surgeries, recent travel.  She states that she has had issues with sarcoidosis in the past but states she has not had any issues with this for quite some time.  She states that she is having some chest tightness.   Wheezing      Prior to Admission medications   Medication Sig Start Date End Date Taking? Authorizing Provider  albuterol (VENTOLIN HFA) 108 (90 Base) MCG/ACT inhaler Inhale 2 puffs into the lungs every 4 (four) hours as needed for wheezing or shortness of breath. 04/13/24  Yes Ula Prentice SAUNDERS, MD  predniSONE  (DELTASONE ) 50 MG tablet Take 1 tablet by mouth daily 04/13/24  Yes Ula Prentice SAUNDERS, MD  ibuprofen  (ADVIL ) 400 MG tablet Take 1 tablet (400 mg total) by mouth every 8 (eight) hours as needed. 03/28/23   Raspet, Rocky POUR, PA-C  lidocaine  (XYLOCAINE ) 2 % solution Use as directed 15 mLs in the mouth or throat every 6 (six) hours as needed for mouth pain. 03/28/23   Raspet, Rocky POUR, PA-C  pantoprazole  (PROTONIX ) 40 MG tablet Take 1 tablet (40 mg total) by mouth daily. 08/13/22   Randol Simmonds, MD  promethazine -dextromethorphan (PROMETHAZINE -DM) 6.25-15 MG/5ML syrup Take 5 mLs by mouth 2 (two) times daily as needed for cough. 03/28/23   Raspet, Erin K, PA-C     Allergies: Aspirin, Hydrocodone , and Orange concentrate [flavoring agent (non-screening)]    Review of Systems  Respiratory:  Positive for wheezing.   All other systems reviewed and are negative.   Updated Vital Signs BP (!) 153/100   Pulse 80   Temp 98.6 F (37 C) (Oral)   Resp (!) 26   Ht 5' 3 (1.6 m)   Wt 71.7 kg   LMP 03/20/2024 (Approximate)   SpO2 100%   BMI 27.99 kg/m   Physical Exam Vitals and nursing note reviewed.   Gen: NAD Eyes: PERRL, EOMI HEENT: no oropharyngeal swelling Neck: trachea midline Resp: Diminished at bilateral lung bases Card: RRR, no murmurs, rubs, or gallops Abd: nontender, nondistended Extremities: no calf tenderness, no edema Vascular: 2+ radial pulses bilaterally, 2+ DP pulses bilaterally Skin: no rashes Psyc: acting appropriately   (all labs ordered are listed, but only abnormal results are displayed) Labs Reviewed  BASIC METABOLIC PANEL WITH GFR - Abnormal; Notable for the following components:      Result Value   CO2 21 (*)    All other components within normal limits  CBC - Abnormal; Notable for the following components:   WBC 3.9 (*)    Hemoglobin 11.2 (*)    HCT 34.0 (*)    All other components within normal limits  URINALYSIS, ROUTINE W REFLEX MICROSCOPIC - Abnormal; Notable for  the following components:   Hgb urine dipstick TRACE (*)    All other components within normal limits  RESP PANEL BY RT-PCR (RSV, FLU A&B, COVID)  [id number]  TROPONIN T, HIGH SENSITIVITY    EKG: EKG Interpretation Date/Time:  "Sunday April 13 2024 11:39:19 EDT Ventricular Rate:  65 PR Interval:  145 QRS Duration:  92 QT Interval:  376 QTC Calculation: 391 R Axis:   78  Text Interpretation: Sinus rhythm Borderline T wave abnormalities Baseline wander in lead(s) V6 Confirmed by Tmya Wigington ([id number]) on 04/13/2024 11:57:11 AM  Radiology: DG Chest Portable 1 View Result Date: 04/13/2024 CLINICAL DATA:  Cough, congestion, and shortness of  breath chest EXAM: PORTABLE CHEST 1 VIEW COMPARISON:  Radiograph dated 08/15/2022 FINDINGS: Normal lung volumes. No focal consolidations. No pleural effusion or pneumothorax. The heart size and mediastinal contours are within normal limits. Similar dextroscoliosis of the thoracic spine. Quadrant surgical clips. IMPRESSION: No active disease. Electronically Signed   By: Limin  Xu M.D.   On: 04/13/2024 11:20     Procedures   Medications Ordered in the ED  ipratropium-albuterol (DUONEB) 0.5-2.5 (3) MG/3ML nebulizer solution 3 mL (3 mLs Nebulization Given 04/13/24 1111)                                    Medical Decision Making 47 year old female with past medical history of sarcoidosis presenting to the emergency department today with cough, shortness of breath, and chest tightness.  I will further evaluate the patient here with basic labs as well as an EKG, chest x-ray, and troponin to further evaluate for myocarditis, pericarditis, pulmonary infiltrates, pulmonary edema, or pneumothorax.  Will also obtain an RSV/COVID/flu swab to evaluate for viral etiologies.  The patient is PERC negative and based on description of her symptoms suspicion for pulmonary embolism is low at this time.  I give the patient a DuoNeb here and see this helps with her symptoms.  I will reevaluate for ultimate disposition.  The patient's work appears reassuring.  Chest x-ray is unremarkable.  Labs are reassuring and her COVID and flu testing are negative.  The patient's pulse ox remained in the high 90s.  On ambulation she remained greater than 95%.  She will be discharged with return precautions.  Will start her on a steroid burst for the next few days and provide her an albuterol inhaler to go home with.  Amount and/or Complexity of Data Reviewed Labs: ordered. Radiology: ordered.  Risk Prescription drug management.        Final diagnoses:  Bronchitis    ED Discharge Orders          Ordered    predniSONE  (DELTASONE) 50 MG tablet        04/13/24 1311    albuterol (VENTOLIN HFA) 108 (90 Base) MCG/ACT inhaler  Every 4 hours PRN        10" /12/25 1311               Ula Prentice SAUNDERS, MD 04/13/24 1312

## 2024-04-13 NOTE — ED Notes (Signed)
 SpO2 96% on room air with ambulation x1 lap around department. Pt tolerated well, no distress.

## 2024-04-13 NOTE — ED Notes (Signed)
 Reviewed AVS/discharge instruction with patient. Time allotted for and all questions answered. Patient is agreeable for d/c and escorted to ed exit by staff.

## 2024-04-13 NOTE — Discharge Instructions (Signed)
 Your workup today was reassuring.  Please take the prednisone  daily as prescribed.  Use 2 puffs of the albuterol every 4 hours as needed for cough or shortness of breath.  Please call and schedule a follow-up appointment with your doctor for reevaluation and return to the ER for worsening symptoms.

## 2024-04-13 NOTE — ED Triage Notes (Signed)
 Pt caox4 ambulatory c/o congestion, cough, malaise and wheezing x4 days. Pt reports she started z-pack at home that she had at home and took prednisone  over the weekend but wheezing has not improved. Neg home COVID test.

## 2024-04-13 NOTE — ED Notes (Signed)
 Ambulatory to restroom
